# Patient Record
Sex: Female | Born: 1945
Health system: Southern US, Community
[De-identification: ages and names within clinical notes are randomized; demographics above are authoritative.]

## PROBLEM LIST (undated history)

## (undated) DIAGNOSIS — I10 Essential (primary) hypertension: Secondary | ICD-10-CM

## (undated) DIAGNOSIS — R32 Unspecified urinary incontinence: Secondary | ICD-10-CM

## (undated) DIAGNOSIS — E559 Vitamin D deficiency, unspecified: Secondary | ICD-10-CM

## (undated) DIAGNOSIS — M8580 Other specified disorders of bone density and structure, unspecified site: Secondary | ICD-10-CM

## (undated) DIAGNOSIS — G4710 Hypersomnia, unspecified: Secondary | ICD-10-CM

## (undated) DIAGNOSIS — K219 Gastro-esophageal reflux disease without esophagitis: Secondary | ICD-10-CM

## (undated) DIAGNOSIS — R5383 Other fatigue: Secondary | ICD-10-CM

## (undated) DIAGNOSIS — J45909 Unspecified asthma, uncomplicated: Secondary | ICD-10-CM

## (undated) DIAGNOSIS — I4891 Unspecified atrial fibrillation: Secondary | ICD-10-CM

## (undated) DIAGNOSIS — E785 Hyperlipidemia, unspecified: Secondary | ICD-10-CM

## (undated) HISTORY — DX: Essential (primary) hypertension: I10

## (undated) HISTORY — DX: Vitamin D deficiency, unspecified: E55.9

## (undated) HISTORY — DX: Hypersomnia, unspecified: G47.10

## (undated) HISTORY — DX: Unspecified atrial fibrillation: I48.91

## (undated) HISTORY — DX: Gastro-esophageal reflux disease without esophagitis: K21.9

## (undated) HISTORY — DX: Unspecified urinary incontinence: R32

## (undated) HISTORY — DX: Hyperlipidemia, unspecified: E78.5

## (undated) HISTORY — DX: Unspecified asthma, uncomplicated: J45.909

## (undated) HISTORY — DX: Other fatigue: R53.83

## (undated) HISTORY — DX: Other specified disorders of bone density and structure, unspecified site: M85.80

## (undated) MED FILL — Iron Sucrose Inj 20 MG/ML (Fe Equiv): INTRAVENOUS | Qty: 10 | Status: AC

---

## 1990-03-23 HISTORY — PX: ABDOMINAL HYSTERECTOMY: SHX81

## 1999-10-28 ENCOUNTER — Encounter: Payer: Self-pay | Admitting: Internal Medicine

## 1999-10-28 ENCOUNTER — Emergency Department (HOSPITAL_COMMUNITY): Admission: EM | Admit: 1999-10-28 | Discharge: 1999-10-28 | Payer: Self-pay | Admitting: Internal Medicine

## 2000-05-21 ENCOUNTER — Encounter: Payer: Self-pay | Admitting: *Deleted

## 2000-05-21 ENCOUNTER — Ambulatory Visit (HOSPITAL_COMMUNITY): Admission: RE | Admit: 2000-05-21 | Discharge: 2000-05-21 | Payer: Self-pay | Admitting: *Deleted

## 2000-10-08 ENCOUNTER — Emergency Department (HOSPITAL_COMMUNITY): Admission: EM | Admit: 2000-10-08 | Discharge: 2000-10-08 | Payer: Self-pay | Admitting: Emergency Medicine

## 2001-01-05 ENCOUNTER — Emergency Department (HOSPITAL_COMMUNITY): Admission: EM | Admit: 2001-01-05 | Discharge: 2001-01-05 | Payer: Self-pay | Admitting: Emergency Medicine

## 2001-01-05 ENCOUNTER — Encounter: Payer: Self-pay | Admitting: Emergency Medicine

## 2002-02-05 ENCOUNTER — Encounter: Payer: Self-pay | Admitting: Emergency Medicine

## 2002-02-06 ENCOUNTER — Inpatient Hospital Stay (HOSPITAL_COMMUNITY): Admission: EM | Admit: 2002-02-06 | Discharge: 2002-02-07 | Payer: Self-pay | Admitting: Emergency Medicine

## 2005-03-23 HISTORY — PX: CHOLECYSTECTOMY: SHX55

## 2011-01-15 ENCOUNTER — Ambulatory Visit: Payer: Self-pay

## 2011-05-04 NOTE — Plan of Care (Addendum)
°

## 2012-06-07 ENCOUNTER — Encounter: Payer: Medicare Other | Admitting: Family Medicine

## 2012-06-14 ENCOUNTER — Encounter: Payer: Self-pay | Admitting: Obstetrics & Gynecology

## 2012-06-14 ENCOUNTER — Ambulatory Visit (INDEPENDENT_AMBULATORY_CARE_PROVIDER_SITE_OTHER): Payer: Medicare Other | Admitting: Obstetrics & Gynecology

## 2012-06-14 VITALS — BP 155/83 | HR 69 | Ht 66.5 in | Wt 215.0 lb

## 2012-06-14 DIAGNOSIS — Z8041 Family history of malignant neoplasm of ovary: Secondary | ICD-10-CM

## 2012-06-14 DIAGNOSIS — R350 Frequency of micturition: Secondary | ICD-10-CM

## 2012-06-14 DIAGNOSIS — R102 Pelvic and perineal pain: Secondary | ICD-10-CM

## 2012-06-14 DIAGNOSIS — N390 Urinary tract infection, site not specified: Secondary | ICD-10-CM

## 2012-06-14 DIAGNOSIS — Z01419 Encounter for gynecological examination (general) (routine) without abnormal findings: Secondary | ICD-10-CM

## 2012-06-14 DIAGNOSIS — Z1239 Encounter for other screening for malignant neoplasm of breast: Secondary | ICD-10-CM

## 2012-06-14 DIAGNOSIS — N949 Unspecified condition associated with female genital organs and menstrual cycle: Secondary | ICD-10-CM

## 2012-06-14 LAB — POCT URINALYSIS DIPSTICK
Bilirubin, UA: NEGATIVE
Blood, UA: NEGATIVE
Ketones, UA: NEGATIVE
Spec Grav, UA: 1.02
pH, UA: 5

## 2012-06-14 MED ORDER — SULFAMETHOXAZOLE-TRIMETHOPRIM 800-160 MG PO TABS: 1.0000 | ORAL_TABLET | Freq: Two times a day (BID) | ORAL | Status: DC

## 2012-06-14 NOTE — Progress Notes (Signed)
Here today for yearly gyn physical.  Dr. Loma Sender is her primary care physician.  Has had several episodes with her sugars being high or low, never been diagnosed with Diabetes.  Does have family history of diabetes. Having urge incontinence, taking oxybutynin CL for this but no relief.  Has family history of breast cancer in her mom and two sisters.

## 2012-06-14 NOTE — Patient Instructions (Signed)
Preventive Care for Adults, Female A healthy lifestyle and preventive care can promote health and wellness. Preventive health guidelines for women include the following key practices.  A routine yearly physical is a good way to check with your caregiver about your health and preventive screening. It is a chance to share any concerns and updates on your health, and to receive a thorough exam.  Visit your dentist for a routine exam and preventive care every 6 months. Brush your teeth twice a day and floss once a day. Good oral hygiene prevents tooth decay and gum disease.  The frequency of eye exams is based on your age, health, family medical history, use of contact lenses, and other factors. Follow your caregiver's recommendations for frequency of eye exams.  Eat a healthy diet. Foods like vegetables, fruits, whole grains, low-fat dairy products, and lean protein foods contain the nutrients you need without too many calories. Decrease your intake of foods high in solid fats, added sugars, and salt. Eat the right amount of calories for you.Get information about a proper diet from your caregiver, if necessary.  Regular physical exercise is one of the most important things you can do for your health. Most adults should get at least 150 minutes of moderate-intensity exercise (any activity that increases your heart rate and causes you to sweat) each week. In addition, most adults need muscle-strengthening exercises on 2 or more days a week.  Maintain a healthy weight. The body mass index (BMI) is a screening tool to identify possible weight problems. It provides an estimate of body fat based on height and weight. Your caregiver can help determine your BMI, and can help you achieve or maintain a healthy weight.For adults 20 years and older:  A BMI below 18.5 is considered underweight.  A BMI of 18.5 to 24.9 is normal.  A BMI of 25 to 29.9 is considered overweight.  A BMI of 30 and above is  considered obese.  Maintain normal blood lipids and cholesterol levels by exercising and minimizing your intake of saturated fat. Eat a balanced diet with plenty of fruit and vegetables. Blood tests for lipids and cholesterol should begin at age 14 and be repeated every 5 years. If your lipid or cholesterol levels are high, you are over 50, or you are at high risk for heart disease, you may need your cholesterol levels checked more frequently.Ongoing high lipid and cholesterol levels should be treated with medicines if diet and exercise are not effective.  If you smoke, find out from your caregiver how to quit. If you do not use tobacco, do not start.  If you are pregnant, do not drink alcohol. If you are breastfeeding, be very cautious about drinking alcohol. If you are not pregnant and choose to drink alcohol, do not exceed 1 drink per day. One drink is considered to be 12 ounces (355 mL) of beer, 5 ounces (148 mL) of wine, or 1.5 ounces (44 mL) of liquor.  Avoid use of street drugs. Do not share needles with anyone. Ask for help if you need support or instructions about stopping the use of drugs.  High blood pressure causes heart disease and increases the risk of stroke. Your blood pressure should be checked at least every 1 to 2 years. Ongoing high blood pressure should be treated with medicines if weight loss and exercise are not effective.  If you are 59 to 66 years old, ask your caregiver if you should take aspirin to prevent strokes.  Diabetes  screening involves taking a blood sample to check your fasting blood sugar level. This should be done once every 3 years, after age 16, if you are within normal weight and without risk factors for diabetes. Testing should be considered at a younger age or be carried out more frequently if you are overweight and have at least 1 risk factor for diabetes.  Breast cancer screening is essential preventive care for women. You should practice "breast  self-awareness." This means understanding the normal appearance and feel of your breasts and may include breast self-examination. Any changes detected, no matter how small, should be reported to a caregiver. Women in their 52s and 30s should have a clinical breast exam (CBE) by a caregiver as part of a regular health exam every 1 to 3 years. After age 72, women should have a CBE every year. Starting at age 53, women should consider having a mammography (breast X-ray test) every year. Women who have a family history of breast cancer should talk to their caregiver about genetic screening. Women at a high risk of breast cancer should talk to their caregivers about having magnetic resonance imaging (MRI) and a mammography every year.  The Pap test is a screening test for cervical cancer. A Pap test can show cell changes on the cervix that might become cervical cancer if left untreated. A Pap test is a procedure in which cells are obtained and examined from the lower end of the uterus (cervix).  Women should have a Pap test starting at age 67.  Between ages 65 and 22, Pap tests should be repeated every 2 years.  Beginning at age 68, you should have a Pap test every 3 years as long as the past 3 Pap tests have been normal.  Some women have medical problems that increase the chance of getting cervical cancer. Talk to your caregiver about these problems. It is especially important to talk to your caregiver if a new problem develops soon after your last Pap test. In these cases, your caregiver may recommend more frequent screening and Pap tests.  The above recommendations are the same for women who have or have not gotten the vaccine for human papillomavirus (HPV).  If you had a hysterectomy for a problem that was not cancer or a condition that could lead to cancer, then you no longer need Pap tests. Even if you no longer need a Pap test, a regular exam is a good idea to make sure no other problems are  starting.  If you are between ages 43 and 26, and you have had normal Pap tests going back 10 years, you no longer need Pap tests. Even if you no longer need a Pap test, a regular exam is a good idea to make sure no other problems are starting.  If you have had past treatment for cervical cancer or a condition that could lead to cancer, you need Pap tests and screening for cancer for at least 20 years after your treatment.  If Pap tests have been discontinued, risk factors (such as a new sexual partner) need to be reassessed to determine if screening should be resumed.  The HPV test is an additional test that may be used for cervical cancer screening. The HPV test looks for the virus that can cause the cell changes on the cervix. The cells collected during the Pap test can be tested for HPV. The HPV test could be used to screen women aged 30 years and older, and should  be used in women of any age who have unclear Pap test results. After the age of 49, women should have HPV testing at the same frequency as a Pap test.  Colorectal cancer can be detected and often prevented. Most routine colorectal cancer screening begins at the age of 12 and continues through age 42. However, your caregiver may recommend screening at an earlier age if you have risk factors for colon cancer. On a yearly basis, your caregiver may provide home test kits to check for hidden blood in the stool. Use of a small camera at the end of a tube, to directly examine the colon (sigmoidoscopy or colonoscopy), can detect the earliest forms of colorectal cancer. Talk to your caregiver about this at age 70, when routine screening begins. Direct examination of the colon should be repeated every 5 to 10 years through age 61, unless early forms of pre-cancerous polyps or small growths are found.  Hepatitis C blood testing is recommended for all people born from [birth year] through 1965 and any individual with known risks for hepatitis C.  Practice  safe sex. Use condoms and avoid high-risk sexual practices to reduce the spread of sexually transmitted infections (STIs). STIs include gonorrhea, chlamydia, syphilis, trichomonas, herpes, HPV, and human immunodeficiency virus (HIV). Herpes, HIV, and HPV are viral illnesses that have no cure. They can result in disability, cancer, and death. Sexually active women aged 13 and younger should be checked for chlamydia. Older women with new or multiple partners should also be tested for chlamydia. Testing for other STIs is recommended if you are sexually active and at increased risk.  Osteoporosis is a disease in which the bones lose minerals and strength with aging. This can result in serious bone fractures. The risk of osteoporosis can be identified using a bone density scan. Women ages 44 and over and women at risk for fractures or osteoporosis should discuss screening with their caregivers. Ask your caregiver whether you should take a calcium supplement or vitamin D to reduce the rate of osteoporosis.  Menopause can be associated with physical symptoms and risks. Hormone replacement therapy is available to decrease symptoms and risks. You should talk to your caregiver about whether hormone replacement therapy is right for you.  Use sunscreen with sun protection factor (SPF) of 30 or more. Apply sunscreen liberally and repeatedly throughout the day. You should seek shade when your shadow is shorter than you. Protect yourself by wearing long sleeves, pants, a wide-brimmed hat, and sunglasses year round, whenever you are outdoors.  Once a month, do a whole body skin exam, using a mirror to look at the skin on your back. Notify your caregiver of new moles, moles that have irregular borders, moles that are larger than a pencil eraser, or moles that have changed in shape or color.  Stay current with required immunizations.  Influenza. You need a dose every fall (or winter). The composition of the flu vaccine  changes each year, so being vaccinated once is not enough.  Pneumococcal polysaccharide. You need 1 to 2 doses if you smoke cigarettes or if you have certain chronic medical conditions. You need 1 dose at age 28 (or older) if you have never been vaccinated.  Tetanus, diphtheria, pertussis (Tdap, Td). Get 1 dose of Tdap vaccine if you are younger than age 63, are over 9 and have contact with an infant, are a Research scientist (physical sciences), are pregnant, or simply want to be protected from whooping cough. After that, you need a Td  booster dose every 10 years. Consult your caregiver if you have not had at least 3 tetanus and diphtheria-containing shots sometime in your life or have a deep or dirty wound.  HPV. You need this vaccine if you are a woman age 51 or younger. The vaccine is given in 3 doses over 6 months.  Measles, mumps, rubella (MMR). You need at least 1 dose of MMR if you were born in 1957 or later. You may also need a second dose.  Meningococcal. If you are age 46 to 83 and a first-year college student living in a residence hall, or have one of several medical conditions, you need to get vaccinated against meningococcal disease. You may also need additional booster doses.  Zoster (shingles). If you are age 16 or older, you should get this vaccine.  Varicella (chickenpox). If you have never had chickenpox or you were vaccinated but received only 1 dose, talk to your caregiver to find out if you need this vaccine.  Hepatitis A. You need this vaccine if you have a specific risk factor for hepatitis A virus infection or you simply wish to be protected from this disease. The vaccine is usually given as 2 doses, 6 to 18 months apart.  Hepatitis B. You need this vaccine if you have a specific risk factor for hepatitis B virus infection or you simply wish to be protected from this disease. The vaccine is given in 3 doses, usually over 6 months. Preventive Services / Frequency Ages 73 to 60  Blood  pressure check.** / Every 1 to 2 years.  Lipid and cholesterol check.** / Every 5 years beginning at age 34.  Clinical breast exam.** / Every 3 years for women in their 36s and 30s.  Pap test.** / Every 2 years from ages 70 through 92. Every 3 years starting at age 63 through age 39 or 61 with a history of 3 consecutive normal Pap tests.  HPV screening.** / Every 3 years from ages 70 through ages 31 to 67 with a history of 3 consecutive normal Pap tests.  Hepatitis C blood test.** / For any individual with known risks for hepatitis C.  Skin self-exam. / Monthly.  Influenza immunization.** / Every year.  Pneumococcal polysaccharide immunization.** / 1 to 2 doses if you smoke cigarettes or if you have certain chronic medical conditions.  Tetanus, diphtheria, pertussis (Tdap, Td) immunization. / A one-time dose of Tdap vaccine. After that, you need a Td booster dose every 10 years.  HPV immunization. / 3 doses over 6 months, if you are 3 and younger.  Measles, mumps, rubella (MMR) immunization. / You need at least 1 dose of MMR if you were born in 1957 or later. You may also need a second dose.  Meningococcal immunization. / 1 dose if you are age 72 to 48 and a first-year college student living in a residence hall, or have one of several medical conditions, you need to get vaccinated against meningococcal disease. You may also need additional booster doses.  Varicella immunization.** / Consult your caregiver.  Hepatitis A immunization.** / Consult your caregiver. 2 doses, 6 to 18 months apart.  Hepatitis B immunization.** / Consult your caregiver. 3 doses usually over 6 months. Ages 15 to 20  Blood pressure check.** / Every 1 to 2 years.  Lipid and cholesterol check.** / Every 5 years beginning at age 4.  Clinical breast exam.** / Every year after age 71.  Mammogram.** / Every year beginning at age 76  and continuing for as long as you are in good health. Consult with your  caregiver.  Pap test.** / Every 3 years starting at age 25 through age 68 or 91 with a history of 3 consecutive normal Pap tests.  HPV screening.** / Every 3 years from ages 14 through ages 39 to 96 with a history of 3 consecutive normal Pap tests.  Fecal occult blood test (FOBT) of stool. / Every year beginning at age 30 and continuing until age 59. You may not need to do this test if you get a colonoscopy every 10 years.  Flexible sigmoidoscopy or colonoscopy.** / Every 5 years for a flexible sigmoidoscopy or every 10 years for a colonoscopy beginning at age 52 and continuing until age 75.  Hepatitis C blood test.** / For all people born from [birth year] through 1965 and any individual with known risks for hepatitis C.  Skin self-exam. / Monthly.  Influenza immunization.** / Every year.  Pneumococcal polysaccharide immunization.** / 1 to 2 doses if you smoke cigarettes or if you have certain chronic medical conditions.  Tetanus, diphtheria, pertussis (Tdap, Td) immunization.** / A one-time dose of Tdap vaccine. After that, you need a Td booster dose every 10 years.  Measles, mumps, rubella (MMR) immunization. / You need at least 1 dose of MMR if you were born in 1957 or later. You may also need a second dose.  Varicella immunization.** / Consult your caregiver.  Meningococcal immunization.** / Consult your caregiver.  Hepatitis A immunization.** / Consult your caregiver. 2 doses, 6 to 18 months apart.  Hepatitis B immunization.** / Consult your caregiver. 3 doses, usually over 6 months. Ages 44 and over  Blood pressure check.** / Every 1 to 2 years.  Lipid and cholesterol check.** / Every 5 years beginning at age 76.  Clinical breast exam.** / Every year after age 68.  Mammogram.** / Every year beginning at age 39 and continuing for as long as you are in good health. Consult with your caregiver.  Pap test.** / Every 3 years starting at age 42 through age 43 or 89 with a 3  consecutive normal Pap tests. Testing can be stopped between 65 and 70 with 3 consecutive normal Pap tests and no abnormal Pap or HPV tests in the past 10 years.  HPV screening.** / Every 3 years from ages 76 through ages 78 or 56 with a history of 3 consecutive normal Pap tests. Testing can be stopped between 65 and 70 with 3 consecutive normal Pap tests and no abnormal Pap or HPV tests in the past 10 years.  Fecal occult blood test (FOBT) of stool. / Every year beginning at age 2 and continuing until age 55. You may not need to do this test if you get a colonoscopy every 10 years.  Flexible sigmoidoscopy or colonoscopy.** / Every 5 years for a flexible sigmoidoscopy or every 10 years for a colonoscopy beginning at age 40 and continuing until age 85.  Hepatitis C blood test.** / For all people born from [birth year] through 1964 and any individual with known risks for hepatitis C.  Osteoporosis screening.** / A one-time screening for women ages 41 and over and women at risk for fractures or osteoporosis.  Skin self-exam. / Monthly.  Influenza immunization.** / Every year.  Pneumococcal polysaccharide immunization.** / 1 dose at age 20 (or older) if you have never been vaccinated.  Tetanus, diphtheria, pertussis (Tdap, Td) immunization. / A one-time dose of Tdap vaccine if you are over  65 and have contact with an infant, are a Research scientist (physical sciences), or simply want to be protected from whooping cough. After that, you need a Td booster dose every 10 years.  Varicella immunization.** / Consult your caregiver.  Meningococcal immunization.** / Consult your caregiver.  Hepatitis A immunization.** / Consult your caregiver. 2 doses, 6 to 18 months apart.  Hepatitis B immunization.** / Check with your caregiver. 3 doses, usually over 6 months. ** Family history and personal history of risk and conditions may change your caregiver's recommendations. Document Released: 05/05/2001 Document Revised: 06/01/2011  Document Reviewed: 08/04/2010 [facility] Patient Information 2013 [place], [place].

## 2012-06-14 NOTE — Progress Notes (Signed)
°  Subjective:     Taylor Arroyo is a 67 y.o. female s/p hysterectomy for benign indications and is here for a comprehensive physical exam. The patient reports frequent urination.  Also reports lower pelvic pain, concerned about her ovaries as her mother died from ovarian cancer.  No other GYN concerns. .  History   Social History   Marital Status: Married    Spouse Name: N/A    Number of Children: N/A   Years of Education: N/A   Occupational History   Not on file.   Social History Main Topics   Smoking status: Never Smoker    Smokeless tobacco: Not on file   Alcohol Use: 0.5 oz/week    1 drink(s) per week   Drug Use: No   Sexually Active: No   Other Topics Concern   Not on file   Social History Narrative   No narrative on file   No health maintenance topics applied.  The following portions of the patient's history were reviewed and updated as appropriate: allergies, current medications, past family history, past medical history, past social history, past surgical history and problem list.  Review of Systems Pertinent items are noted in HPI.   Objective:   BP 155/83   Pulse 69   Ht 5' 6.5" (1.689 m)   Wt 215 lb (97.523 kg)   BMI 34.19 kg/m2 GENERAL: Well-developed, well-nourished female in no acute distress.  HEENT: Normocephalic, atraumatic. Sclerae anicteric.  NECK: Supple. Normal thyroid.  LUNGS: Clear to auscultation bilaterally.  HEART: Regular rate and rhythm. BREASTS: Symmetric in size. No masses, skin changes, nipple drainage, or lymphadenopathy. ABDOMEN: Soft, obese, nontender, nondistended. No organomegaly. PELVIC: Normal external female genitalia. Vagina is pink and rugated.  Normal discharge. Normal vaginal cuff. No adnexal mass or tenderness on bimanual exam.  EXTREMITIES: No cyanosis, clubbing, or edema, 2+ distal pulses.  Results for orders placed in visit on 06/14/12 (from the past 24 hour(s))  POCT URINALYSIS DIPSTICK     Status:  Abnormal   Collection Time    06/14/12 11:47 AM      Result Value Range   Color, UA yellow     Clarity, UA clear     Glucose, UA neg     Bilirubin, UA neg     Ketones, UA neg     Spec Grav, UA 1.020     Blood, UA neg     pH, UA 5.0     Protein, UA trace     Urobilinogen, UA 0.2     Nitrite, UA neg     Leukocytes, UA small (1+)        Assessment:    Healthy female exam. Urinary symptoms Pelvic pain     Plan:   No pap smear indicated, not done today Mammogram scheduled UA showed small LE, trace protein; Bactrim prescribed.  Will follow up urine culture Pelvic ultrasound ordered for evaluation of her pelvic pain and ovaries Referral to [facility] ordered; she wants to change PCPs Routine preventative health maintenance measures emphasized

## 2012-06-24 ENCOUNTER — Ambulatory Visit (HOSPITAL_COMMUNITY)
Admission: RE | Admit: 2012-06-24 | Discharge: 2012-06-24 | Disposition: A | Discharge status: Home / Self Care | Payer: Medicare Other | Source: Ambulatory Visit | Attending: Obstetrics & Gynecology | Admitting: Obstetrics & Gynecology

## 2012-06-24 DIAGNOSIS — N949 Unspecified condition associated with female genital organs and menstrual cycle: Secondary | ICD-10-CM | POA: Insufficient documentation

## 2012-06-24 DIAGNOSIS — Z8041 Family history of malignant neoplasm of ovary: Secondary | ICD-10-CM

## 2012-06-24 DIAGNOSIS — Z1239 Encounter for other screening for malignant neoplasm of breast: Secondary | ICD-10-CM

## 2012-06-24 DIAGNOSIS — R102 Pelvic and perineal pain: Secondary | ICD-10-CM

## 2012-06-24 DIAGNOSIS — Z1231 Encounter for screening mammogram for malignant neoplasm of breast: Secondary | ICD-10-CM | POA: Insufficient documentation

## 2012-06-24 DIAGNOSIS — Z01419 Encounter for gynecological examination (general) (routine) without abnormal findings: Secondary | ICD-10-CM

## 2012-06-24 DIAGNOSIS — Z90710 Acquired absence of both cervix and uterus: Secondary | ICD-10-CM | POA: Insufficient documentation

## 2012-06-24 NOTE — Procedures (Signed)
°

## 2012-09-07 ENCOUNTER — Encounter: Payer: Self-pay | Admitting: Obstetrics & Gynecology

## 2012-09-07 ENCOUNTER — Ambulatory Visit (INDEPENDENT_AMBULATORY_CARE_PROVIDER_SITE_OTHER): Payer: Medicare Other | Admitting: Obstetrics & Gynecology

## 2012-09-07 VITALS — BP 163/78 | HR 67 | Ht 66.5 in | Wt 218.0 lb

## 2012-09-07 DIAGNOSIS — M62838 Other muscle spasm: Secondary | ICD-10-CM

## 2012-09-07 DIAGNOSIS — Z7189 Other specified counseling: Secondary | ICD-10-CM

## 2012-09-07 DIAGNOSIS — Z712 Person consulting for explanation of examination or test findings: Secondary | ICD-10-CM

## 2012-09-07 MED ORDER — CYCLOBENZAPRINE HCL 10 MG PO TABS: 10.0000 mg | ORAL_TABLET | Freq: Three times a day (TID) | ORAL | Status: DC | PRN

## 2012-09-07 NOTE — Patient Instructions (Signed)
Return to clinic for any scheduled appointments or for any gynecologic concerns as needed.

## 2012-09-07 NOTE — Progress Notes (Signed)
GYNECOLOGY CLINIC PROGRESS NOTE  History:  67 y.o. [id number] here today for followup of results of pelvic ultrasound.  Reports muscle spasm in waist and back from carrying disable mother and helping her at home. No GYN concerns.   The following portions of the patient's history were reviewed and updated as appropriate: allergies, current medications, past family history, past medical history, past social history, past surgical history and problem list.  Review of Systems:  Pertinent items are noted in HPI.  Objective:  Physical Exam BP 163/78   Pulse 67   Ht 5' 6.5" (1.689 m)   Wt 218 lb (98.884 kg)   BMI 34.66 kg/m2 Deferred  Labs and Imaging 06/27/12 Normal mammogram 06/24/2012  TRANSABDOMINAL AND TRANSVAGINAL ULTRASOUND OF PELVIS Clinical Data: Abdominal and pelvic pain. Post hysterectomy. Family history of ovarian cancer. Comparison: None Findings: Uterus: Has been surgically removed. A normal vaginal cuff is seen. Endometrium: Not applicable. Right ovary: Is not seen with confidence either transabdominally or endovaginally.  Left ovary: Measures 1.9 x 1.2 x 1.2 cm and has a normal appearance. Other findings: No pelvic fluid or separate adnexal masses are seen  IMPRESSION: Normal vaginal cuff and left ovary. Non-visualized right ovary.    Assessment & Plan:  Patient reassured by negative ultrasound Flexeril prescribed prn muscle spasms Routine preventative health maintenance measures emphasized

## 2012-10-10 ENCOUNTER — Encounter (HOSPITAL_COMMUNITY): Payer: Self-pay | Admitting: *Deleted

## 2012-10-10 ENCOUNTER — Emergency Department (HOSPITAL_COMMUNITY): Payer: Medicare Other

## 2012-10-10 ENCOUNTER — Emergency Department (HOSPITAL_COMMUNITY)
Admission: EM | Admit: 2012-10-10 | Discharge: 2012-10-10 | Disposition: A | Discharge status: Home / Self Care | Payer: Medicare Other | Attending: Emergency Medicine | Admitting: Emergency Medicine

## 2012-10-10 DIAGNOSIS — M1710 Unilateral primary osteoarthritis, unspecified knee: Secondary | ICD-10-CM | POA: Insufficient documentation

## 2012-10-10 DIAGNOSIS — IMO0001 Reserved for inherently not codable concepts without codable children: Secondary | ICD-10-CM | POA: Insufficient documentation

## 2012-10-10 DIAGNOSIS — M170 Bilateral primary osteoarthritis of knee: Secondary | ICD-10-CM

## 2012-10-10 DIAGNOSIS — I10 Essential (primary) hypertension: Secondary | ICD-10-CM | POA: Insufficient documentation

## 2012-10-10 DIAGNOSIS — Z880 Allergy status to penicillin: Secondary | ICD-10-CM | POA: Insufficient documentation

## 2012-10-10 DIAGNOSIS — IMO0002 Reserved for concepts with insufficient information to code with codable children: Secondary | ICD-10-CM | POA: Insufficient documentation

## 2012-10-10 DIAGNOSIS — Z79899 Other long term (current) drug therapy: Secondary | ICD-10-CM | POA: Insufficient documentation

## 2012-10-10 DIAGNOSIS — M25469 Effusion, unspecified knee: Secondary | ICD-10-CM | POA: Insufficient documentation

## 2012-10-10 DIAGNOSIS — R609 Edema, unspecified: Secondary | ICD-10-CM | POA: Insufficient documentation

## 2012-10-10 MED ORDER — HYDROCODONE-ACETAMINOPHEN 5-325 MG PO TABS: ORAL_TABLET | ORAL | Status: DC

## 2012-10-10 NOTE — ED Provider Notes (Signed)
History    CSN: [id number] Arrival date & time 10/10/12  1211  First MD Initiated Contact with Patient 10/10/12 1219     Chief Complaint  Patient presents with   Knee Pain   (Consider location/radiation/quality/duration/timing/severity/associated sxs/prior Treatment) HPI Pt is a 67yo female presenting today with bilateral knee pain for 20yr.  Advised to come to ER or orthopedist by PCP.  Pt states she was given tramadol by PCP but that has not helped with her pain.  Pain is constant aching and burning in medial aspect of both knees, 10/10 at worst, 6/10 at best.   Pain worsened over the last 4 days after doing house work this weekend.  Has tried tramadol, advil, aleve, and ibuprofen with minimal relief.  Denies any trauma to the knees.  Has never seen orthopedist for same.    Past Medical History  Diagnosis Date   Hypertension    Past Surgical History  Procedure Laterality Date   Abdominal hysterectomy  1992    still has ovaries   Cholecystectomy  2007   Family History  Problem Relation Age of Onset   Diabetes Mother    Heart disease Mother    Stroke Mother    Cancer Mother    Diabetes Sister    Heart disease Sister    Cancer Sister    History  Substance Use Topics   Smoking status: Never Smoker    Smokeless tobacco: Never Used   Alcohol Use: 0.5 oz/week    1 drink(s) per week     Comment: social   OB History   Grav Para Term Preterm Abortions TAB SAB Ect Mult Living   7    4  4   3      Review of Systems  Constitutional: Negative for fever and chills.  Musculoskeletal: Positive for myalgias, joint swelling and arthralgias.       Bilateral knees   Skin: Negative for color change, rash and wound.  All other systems reviewed and are negative.    Allergies  Penicillins  Home Medications   Current Outpatient Rx  Name  Route  Sig  Dispense  Refill   acetaminophen (TYLENOL) 500 MG tablet   Oral   Take 500 mg by mouth every 6 (six) hours as  needed for pain.          Aspirin-Caffeine (BC FAST PAIN RELIEF PO)   Oral   Take 1 packet by mouth.          cyclobenzaprine (FLEXERIL) 10 MG tablet   Oral   Take 1 tablet (10 mg total) by mouth 3 (three) times daily as needed for muscle spasms.   30 tablet   2    diazepam (VALIUM) 5 MG tablet   Oral   Take 5 mg by mouth every 12 (twelve) hours as needed for anxiety.           enalapril (VASOTEC) 5 MG tablet   Oral   Take 5 mg by mouth daily.          furosemide (LASIX) 20 MG tablet   Oral   Take 20 mg by mouth 2 (two) times daily.          hydrOXYzine (ATARAX/VISTARIL) 25 MG tablet   Oral   Take 25 mg by mouth 3 (three) times daily as needed for itching.          meloxicam (MOBIC) 7.5 MG tablet   Oral   Take 7.5 mg by mouth daily.  naproxen sodium (ANAPROX) 220 MG tablet   Oral   Take 220 mg by mouth 2 (two) times daily with a meal.          oxybutynin (DITROPAN) 5 MG tablet   Oral   Take 5 mg by mouth 3 (three) times daily.          propranolol (INDERAL) 20 MG tablet   Oral   Take 20 mg by mouth 3 (three) times daily.          traMADol (ULTRAM) 50 MG tablet   Oral   Take 50 mg by mouth every 6 (six) hours as needed for pain.          triamterene-hydrochlorothiazide (DYAZIDE) 37.5-25 MG per capsule   Oral   Take 1 capsule by mouth every morning.          HYDROcodone-acetaminophen (NORCO/VICODIN) 5-325 MG per tablet      Take 1-2 pills every 4-6 hours as needed for pain   6 tablet   0    BP 152/67   Pulse 68   Temp(Src) 98 F (36.7 C) (Oral)   Resp 18   SpO2 100% Physical Exam  Nursing note and vitals reviewed. Constitutional: She appears well-developed and well-nourished.  HENT:  Head: Normocephalic and atraumatic.  Eyes: Conjunctivae are normal. No scleral icterus.  Neck: Normal range of motion.  Cardiovascular: Normal rate, regular rhythm and normal heart sounds.   Pulmonary/Chest: Effort normal and  breath sounds normal. No respiratory distress. She has no wheezes. She has no rales. She exhibits no tenderness.  Musculoskeletal: Normal range of motion. She exhibits edema (mild edema of inferior portion of left knee) and tenderness ( inferior, medial aspect of both knees).  Neurological: She is alert.  Skin: Skin is warm and dry. No rash noted. No erythema. No pallor.  Psychiatric: She has a normal mood and affect. Her behavior is normal.    ED Course  Procedures (including critical care time) Labs Reviewed - No data to display Dg Knee Complete 4 Views Left  10/10/2012   *RADIOLOGY REPORT*  Clinical Data: Bilateral knee pain and burning for past year, progressively worse, no known injury  LEFT KNEE - COMPLETE 4+ VIEW  Comparison: None  Findings: Mild joint space narrowing. Bones demineralized. Subchondral cyst formation and articular surface irregularity at the patella. Small patellar spur at quadriceps tendon insertion. No acute fracture, dislocation or bone destruction. No knee joint effusion.  IMPRESSION: Osseous demineralization with mild degenerative changes left knee.   Original Report Authenticated By: Ulyses Southward, M.D.   Dg Knee Complete 4 Views Right  10/10/2012   *RADIOLOGY REPORT*  Clinical Data: Bilateral knee pain and burning for past year, progressively worse, no known injury  RIGHT KNEE - COMPLETE 4+ VIEW  Comparison: None  Findings: Osseous demineralization. Joint space narrowing and marginal spur formation greatest at medial compartment. No acute fracture, dislocation or bone destruction. No knee joint effusion or regional soft tissue abnormalities.  IMPRESSION: Osseous demineralization with osteoarthritic changes right knee.   Original Report Authenticated By: Ulyses Southward, M.D.   1. Osteoarthritis of both knees     MDM  Pt has bilateral knee pain for 1 year.  Pt likely has OA.  Will get plain films and refer to orthopedist.    Plain films: osseous demineralization in both  knees, osteoarthritic changes in right knee.   Rx: norco (6 tabs) and knee sleeves.  Pt is already taking mobic and acetaminophen.  Referred to Dr.  Chandler to make a follow up appointment for ongoing knee pain.  May also try [facility] if unable to get earlier appointment.  Return precautions given. Pt verbalized understanding and agreement with tx plan. Vitals: unremarkable. Discharged in stable condition.       Junius Finner, PA-C 10/11/12 1235

## 2012-10-10 NOTE — ED Notes (Signed)
Pt states she's had bil knee pain x 1 year, has seen PCP once for knee pain and she states he told her she needed to come to ER or see an orthopedic b/c that wasn't his specialty, pt states tramadol he gave her is not working for pain, pt states pain in R knee is 10/10 and pain in L knee is 5/10, burning pain.

## 2012-10-11 NOTE — Consent Form (Signed)
°

## 2012-10-13 NOTE — ED Provider Notes (Signed)
Medical screening examination/treatment/procedure(s) were performed by non-physician practitioner and as supervising physician I was immediately available for consultation/collaboration.   Benny Lennert, MD 10/13/12 [phone]

## 2012-12-28 ENCOUNTER — Other Ambulatory Visit: Payer: Self-pay | Admitting: Obstetrics & Gynecology

## 2013-03-06 ENCOUNTER — Other Ambulatory Visit: Payer: Self-pay | Admitting: Obstetrics & Gynecology

## 2013-03-28 ENCOUNTER — Other Ambulatory Visit: Payer: Self-pay | Admitting: Obstetrics & Gynecology

## 2014-01-22 ENCOUNTER — Encounter (HOSPITAL_COMMUNITY): Payer: Self-pay | Admitting: *Deleted

## 2017-05-10 DIAGNOSIS — M62838 Other muscle spasm: Secondary | ICD-10-CM | POA: Diagnosis not present

## 2017-05-10 DIAGNOSIS — K219 Gastro-esophageal reflux disease without esophagitis: Secondary | ICD-10-CM | POA: Diagnosis not present

## 2017-05-10 DIAGNOSIS — I10 Essential (primary) hypertension: Secondary | ICD-10-CM | POA: Diagnosis not present

## 2017-05-10 DIAGNOSIS — Z9181 History of falling: Secondary | ICD-10-CM | POA: Diagnosis not present

## 2017-05-10 DIAGNOSIS — E669 Obesity, unspecified: Secondary | ICD-10-CM | POA: Diagnosis not present

## 2017-05-10 DIAGNOSIS — E538 Deficiency of other specified B group vitamins: Secondary | ICD-10-CM | POA: Diagnosis not present

## 2017-05-10 DIAGNOSIS — Z79899 Other long term (current) drug therapy: Secondary | ICD-10-CM | POA: Diagnosis not present

## 2017-05-10 DIAGNOSIS — F419 Anxiety disorder, unspecified: Secondary | ICD-10-CM | POA: Diagnosis not present

## 2017-05-10 DIAGNOSIS — E782 Mixed hyperlipidemia: Secondary | ICD-10-CM | POA: Diagnosis not present

## 2017-05-10 DIAGNOSIS — Z1331 Encounter for screening for depression: Secondary | ICD-10-CM | POA: Diagnosis not present

## 2017-05-31 DIAGNOSIS — Z6832 Body mass index (BMI) 32.0-32.9, adult: Secondary | ICD-10-CM | POA: Diagnosis not present

## 2017-05-31 DIAGNOSIS — Z1231 Encounter for screening mammogram for malignant neoplasm of breast: Secondary | ICD-10-CM | POA: Diagnosis not present

## 2017-05-31 DIAGNOSIS — Z0000 Encounter for general adult medical examination without abnormal findings: Secondary | ICD-10-CM | POA: Diagnosis not present

## 2017-05-31 DIAGNOSIS — Z139 Encounter for screening, unspecified: Secondary | ICD-10-CM | POA: Diagnosis not present

## 2017-05-31 DIAGNOSIS — Z23 Encounter for immunization: Secondary | ICD-10-CM | POA: Diagnosis not present

## 2017-05-31 DIAGNOSIS — E785 Hyperlipidemia, unspecified: Secondary | ICD-10-CM | POA: Diagnosis not present

## 2017-05-31 DIAGNOSIS — N959 Unspecified menopausal and perimenopausal disorder: Secondary | ICD-10-CM | POA: Diagnosis not present

## 2017-05-31 DIAGNOSIS — Z136 Encounter for screening for cardiovascular disorders: Secondary | ICD-10-CM | POA: Diagnosis not present

## 2017-05-31 DIAGNOSIS — E669 Obesity, unspecified: Secondary | ICD-10-CM | POA: Diagnosis not present

## 2017-06-07 DIAGNOSIS — M170 Bilateral primary osteoarthritis of knee: Secondary | ICD-10-CM | POA: Diagnosis not present

## 2017-09-27 DIAGNOSIS — M1711 Unilateral primary osteoarthritis, right knee: Secondary | ICD-10-CM | POA: Diagnosis not present

## 2017-09-27 DIAGNOSIS — M1712 Unilateral primary osteoarthritis, left knee: Secondary | ICD-10-CM | POA: Diagnosis not present

## 2017-12-16 DIAGNOSIS — F419 Anxiety disorder, unspecified: Secondary | ICD-10-CM | POA: Diagnosis not present

## 2017-12-16 DIAGNOSIS — M170 Bilateral primary osteoarthritis of knee: Secondary | ICD-10-CM | POA: Diagnosis not present

## 2017-12-16 DIAGNOSIS — Z6833 Body mass index (BMI) 33.0-33.9, adult: Secondary | ICD-10-CM | POA: Diagnosis not present

## 2017-12-16 DIAGNOSIS — Z23 Encounter for immunization: Secondary | ICD-10-CM | POA: Diagnosis not present

## 2017-12-16 DIAGNOSIS — Z79899 Other long term (current) drug therapy: Secondary | ICD-10-CM | POA: Diagnosis not present

## 2017-12-16 DIAGNOSIS — J309 Allergic rhinitis, unspecified: Secondary | ICD-10-CM | POA: Diagnosis not present

## 2017-12-16 DIAGNOSIS — N39498 Other specified urinary incontinence: Secondary | ICD-10-CM | POA: Diagnosis not present

## 2018-02-15 DIAGNOSIS — M170 Bilateral primary osteoarthritis of knee: Secondary | ICD-10-CM | POA: Diagnosis not present

## 2018-02-15 DIAGNOSIS — R399 Unspecified symptoms and signs involving the genitourinary system: Secondary | ICD-10-CM | POA: Diagnosis not present

## 2018-02-15 DIAGNOSIS — Z6834 Body mass index (BMI) 34.0-34.9, adult: Secondary | ICD-10-CM | POA: Diagnosis not present

## 2018-04-18 DIAGNOSIS — J0191 Acute recurrent sinusitis, unspecified: Secondary | ICD-10-CM | POA: Diagnosis not present

## 2018-04-18 DIAGNOSIS — Z6833 Body mass index (BMI) 33.0-33.9, adult: Secondary | ICD-10-CM | POA: Diagnosis not present

## 2018-04-18 DIAGNOSIS — J209 Acute bronchitis, unspecified: Secondary | ICD-10-CM | POA: Diagnosis not present

## 2018-06-20 DIAGNOSIS — J209 Acute bronchitis, unspecified: Secondary | ICD-10-CM | POA: Diagnosis not present

## 2018-09-12 DIAGNOSIS — M62838 Other muscle spasm: Secondary | ICD-10-CM | POA: Diagnosis not present

## 2018-09-12 DIAGNOSIS — Z9181 History of falling: Secondary | ICD-10-CM | POA: Diagnosis not present

## 2018-09-12 DIAGNOSIS — Z1331 Encounter for screening for depression: Secondary | ICD-10-CM | POA: Diagnosis not present

## 2018-09-12 DIAGNOSIS — I10 Essential (primary) hypertension: Secondary | ICD-10-CM | POA: Diagnosis not present

## 2018-09-12 DIAGNOSIS — F419 Anxiety disorder, unspecified: Secondary | ICD-10-CM | POA: Diagnosis not present

## 2018-09-12 DIAGNOSIS — E669 Obesity, unspecified: Secondary | ICD-10-CM | POA: Diagnosis not present

## 2018-09-12 DIAGNOSIS — Z79899 Other long term (current) drug therapy: Secondary | ICD-10-CM | POA: Diagnosis not present

## 2018-09-12 DIAGNOSIS — K219 Gastro-esophageal reflux disease without esophagitis: Secondary | ICD-10-CM | POA: Diagnosis not present

## 2018-09-12 DIAGNOSIS — E782 Mixed hyperlipidemia: Secondary | ICD-10-CM | POA: Diagnosis not present

## 2018-09-12 DIAGNOSIS — E538 Deficiency of other specified B group vitamins: Secondary | ICD-10-CM | POA: Diagnosis not present

## 2018-12-20 DIAGNOSIS — J45909 Unspecified asthma, uncomplicated: Secondary | ICD-10-CM | POA: Diagnosis not present

## 2018-12-20 DIAGNOSIS — Z1211 Encounter for screening for malignant neoplasm of colon: Secondary | ICD-10-CM | POA: Diagnosis not present

## 2018-12-20 DIAGNOSIS — Z6833 Body mass index (BMI) 33.0-33.9, adult: Secondary | ICD-10-CM | POA: Diagnosis not present

## 2018-12-20 DIAGNOSIS — Z1231 Encounter for screening mammogram for malignant neoplasm of breast: Secondary | ICD-10-CM | POA: Diagnosis not present

## 2018-12-20 DIAGNOSIS — Z23 Encounter for immunization: Secondary | ICD-10-CM | POA: Diagnosis not present

## 2018-12-20 DIAGNOSIS — Z139 Encounter for screening, unspecified: Secondary | ICD-10-CM | POA: Diagnosis not present

## 2018-12-20 DIAGNOSIS — N959 Unspecified menopausal and perimenopausal disorder: Secondary | ICD-10-CM | POA: Diagnosis not present

## 2018-12-22 ENCOUNTER — Other Ambulatory Visit: Payer: Self-pay | Admitting: Nurse Practitioner

## 2018-12-22 DIAGNOSIS — E2839 Other primary ovarian failure: Secondary | ICD-10-CM

## 2018-12-22 DIAGNOSIS — Z1231 Encounter for screening mammogram for malignant neoplasm of breast: Secondary | ICD-10-CM

## 2019-05-03 DIAGNOSIS — I10 Essential (primary) hypertension: Secondary | ICD-10-CM | POA: Diagnosis not present

## 2019-05-03 DIAGNOSIS — K219 Gastro-esophageal reflux disease without esophagitis: Secondary | ICD-10-CM | POA: Diagnosis not present

## 2019-05-03 DIAGNOSIS — E559 Vitamin D deficiency, unspecified: Secondary | ICD-10-CM | POA: Diagnosis not present

## 2019-05-03 DIAGNOSIS — Z79899 Other long term (current) drug therapy: Secondary | ICD-10-CM | POA: Diagnosis not present

## 2019-05-03 DIAGNOSIS — F419 Anxiety disorder, unspecified: Secondary | ICD-10-CM | POA: Diagnosis not present

## 2019-05-03 DIAGNOSIS — E782 Mixed hyperlipidemia: Secondary | ICD-10-CM | POA: Diagnosis not present

## 2019-05-03 DIAGNOSIS — E669 Obesity, unspecified: Secondary | ICD-10-CM | POA: Diagnosis not present

## 2019-05-03 DIAGNOSIS — M62838 Other muscle spasm: Secondary | ICD-10-CM | POA: Diagnosis not present

## 2019-05-03 DIAGNOSIS — M170 Bilateral primary osteoarthritis of knee: Secondary | ICD-10-CM | POA: Diagnosis not present

## 2019-05-03 DIAGNOSIS — E538 Deficiency of other specified B group vitamins: Secondary | ICD-10-CM | POA: Diagnosis not present

## 2019-05-24 DIAGNOSIS — R300 Dysuria: Secondary | ICD-10-CM | POA: Diagnosis not present

## 2019-05-24 DIAGNOSIS — N390 Urinary tract infection, site not specified: Secondary | ICD-10-CM | POA: Diagnosis not present

## 2019-05-24 DIAGNOSIS — Z6834 Body mass index (BMI) 34.0-34.9, adult: Secondary | ICD-10-CM | POA: Diagnosis not present

## 2019-07-10 DIAGNOSIS — N390 Urinary tract infection, site not specified: Secondary | ICD-10-CM | POA: Diagnosis not present

## 2019-07-10 DIAGNOSIS — N3941 Urge incontinence: Secondary | ICD-10-CM | POA: Diagnosis not present

## 2019-07-10 DIAGNOSIS — N3946 Mixed incontinence: Secondary | ICD-10-CM | POA: Diagnosis not present

## 2019-07-10 DIAGNOSIS — N3281 Overactive bladder: Secondary | ICD-10-CM | POA: Diagnosis not present

## 2019-07-10 DIAGNOSIS — N952 Postmenopausal atrophic vaginitis: Secondary | ICD-10-CM | POA: Diagnosis not present

## 2019-07-17 DIAGNOSIS — J45901 Unspecified asthma with (acute) exacerbation: Secondary | ICD-10-CM | POA: Diagnosis not present

## 2019-07-17 DIAGNOSIS — Z6834 Body mass index (BMI) 34.0-34.9, adult: Secondary | ICD-10-CM | POA: Diagnosis not present

## 2019-07-31 DIAGNOSIS — E559 Vitamin D deficiency, unspecified: Secondary | ICD-10-CM | POA: Diagnosis not present

## 2019-07-31 DIAGNOSIS — I10 Essential (primary) hypertension: Secondary | ICD-10-CM | POA: Diagnosis not present

## 2019-07-31 DIAGNOSIS — F419 Anxiety disorder, unspecified: Secondary | ICD-10-CM | POA: Diagnosis not present

## 2019-07-31 DIAGNOSIS — E669 Obesity, unspecified: Secondary | ICD-10-CM | POA: Diagnosis not present

## 2019-07-31 DIAGNOSIS — M62838 Other muscle spasm: Secondary | ICD-10-CM | POA: Diagnosis not present

## 2019-07-31 DIAGNOSIS — E782 Mixed hyperlipidemia: Secondary | ICD-10-CM | POA: Diagnosis not present

## 2019-07-31 DIAGNOSIS — Z79899 Other long term (current) drug therapy: Secondary | ICD-10-CM | POA: Diagnosis not present

## 2019-07-31 DIAGNOSIS — J309 Allergic rhinitis, unspecified: Secondary | ICD-10-CM | POA: Diagnosis not present

## 2019-07-31 DIAGNOSIS — M170 Bilateral primary osteoarthritis of knee: Secondary | ICD-10-CM | POA: Diagnosis not present

## 2019-07-31 DIAGNOSIS — K219 Gastro-esophageal reflux disease without esophagitis: Secondary | ICD-10-CM | POA: Diagnosis not present

## 2019-07-31 DIAGNOSIS — E538 Deficiency of other specified B group vitamins: Secondary | ICD-10-CM | POA: Diagnosis not present

## 2020-01-04 DIAGNOSIS — Z23 Encounter for immunization: Secondary | ICD-10-CM | POA: Diagnosis not present

## 2020-01-04 DIAGNOSIS — I10 Essential (primary) hypertension: Secondary | ICD-10-CM | POA: Diagnosis not present

## 2020-01-04 DIAGNOSIS — K219 Gastro-esophageal reflux disease without esophagitis: Secondary | ICD-10-CM | POA: Diagnosis not present

## 2020-01-04 DIAGNOSIS — M170 Bilateral primary osteoarthritis of knee: Secondary | ICD-10-CM | POA: Diagnosis not present

## 2020-01-04 DIAGNOSIS — J309 Allergic rhinitis, unspecified: Secondary | ICD-10-CM | POA: Diagnosis not present

## 2020-01-04 DIAGNOSIS — R69 Illness, unspecified: Secondary | ICD-10-CM | POA: Diagnosis not present

## 2020-01-04 DIAGNOSIS — E559 Vitamin D deficiency, unspecified: Secondary | ICD-10-CM | POA: Diagnosis not present

## 2020-01-04 DIAGNOSIS — J45909 Unspecified asthma, uncomplicated: Secondary | ICD-10-CM | POA: Diagnosis not present

## 2020-01-04 DIAGNOSIS — Z79899 Other long term (current) drug therapy: Secondary | ICD-10-CM | POA: Diagnosis not present

## 2020-01-04 DIAGNOSIS — E782 Mixed hyperlipidemia: Secondary | ICD-10-CM | POA: Diagnosis not present

## 2020-01-09 DIAGNOSIS — Z9181 History of falling: Secondary | ICD-10-CM | POA: Diagnosis not present

## 2020-01-09 DIAGNOSIS — Z139 Encounter for screening, unspecified: Secondary | ICD-10-CM | POA: Diagnosis not present

## 2020-01-09 DIAGNOSIS — N390 Urinary tract infection, site not specified: Secondary | ICD-10-CM | POA: Diagnosis not present

## 2020-04-05 DIAGNOSIS — I10 Essential (primary) hypertension: Secondary | ICD-10-CM | POA: Diagnosis not present

## 2020-04-05 DIAGNOSIS — E559 Vitamin D deficiency, unspecified: Secondary | ICD-10-CM | POA: Diagnosis not present

## 2020-04-05 DIAGNOSIS — Z79899 Other long term (current) drug therapy: Secondary | ICD-10-CM | POA: Diagnosis not present

## 2020-04-05 DIAGNOSIS — Z1331 Encounter for screening for depression: Secondary | ICD-10-CM | POA: Diagnosis not present

## 2020-04-05 DIAGNOSIS — J45909 Unspecified asthma, uncomplicated: Secondary | ICD-10-CM | POA: Diagnosis not present

## 2020-04-05 DIAGNOSIS — K219 Gastro-esophageal reflux disease without esophagitis: Secondary | ICD-10-CM | POA: Diagnosis not present

## 2020-04-05 DIAGNOSIS — R69 Illness, unspecified: Secondary | ICD-10-CM | POA: Diagnosis not present

## 2020-04-05 DIAGNOSIS — J309 Allergic rhinitis, unspecified: Secondary | ICD-10-CM | POA: Diagnosis not present

## 2020-04-05 DIAGNOSIS — E782 Mixed hyperlipidemia: Secondary | ICD-10-CM | POA: Diagnosis not present

## 2020-04-05 DIAGNOSIS — M170 Bilateral primary osteoarthritis of knee: Secondary | ICD-10-CM | POA: Diagnosis not present

## 2020-05-16 DIAGNOSIS — R69 Illness, unspecified: Secondary | ICD-10-CM | POA: Diagnosis not present

## 2020-05-16 DIAGNOSIS — H52223 Regular astigmatism, bilateral: Secondary | ICD-10-CM | POA: Diagnosis not present

## 2020-05-16 DIAGNOSIS — A5203 Syphilitic endocarditis: Secondary | ICD-10-CM | POA: Diagnosis not present

## 2020-05-16 DIAGNOSIS — H2513 Age-related nuclear cataract, bilateral: Secondary | ICD-10-CM | POA: Diagnosis not present

## 2020-12-16 ENCOUNTER — Other Ambulatory Visit: Payer: Self-pay | Admitting: Urology

## 2020-12-16 DIAGNOSIS — I722 Aneurysm of renal artery: Secondary | ICD-10-CM

## 2020-12-26 DIAGNOSIS — I480 Paroxysmal atrial fibrillation: Secondary | ICD-10-CM | POA: Diagnosis not present

## 2020-12-27 DIAGNOSIS — I249 Acute ischemic heart disease, unspecified: Secondary | ICD-10-CM | POA: Diagnosis not present

## 2020-12-27 DIAGNOSIS — I10 Essential (primary) hypertension: Secondary | ICD-10-CM | POA: Diagnosis not present

## 2020-12-27 DIAGNOSIS — I480 Paroxysmal atrial fibrillation: Secondary | ICD-10-CM | POA: Diagnosis not present

## 2020-12-28 DIAGNOSIS — I214 Non-ST elevation (NSTEMI) myocardial infarction: Secondary | ICD-10-CM

## 2020-12-28 DIAGNOSIS — I480 Paroxysmal atrial fibrillation: Secondary | ICD-10-CM

## 2020-12-28 DIAGNOSIS — R319 Hematuria, unspecified: Secondary | ICD-10-CM

## 2020-12-29 DIAGNOSIS — R319 Hematuria, unspecified: Secondary | ICD-10-CM | POA: Diagnosis not present

## 2020-12-29 DIAGNOSIS — I480 Paroxysmal atrial fibrillation: Secondary | ICD-10-CM | POA: Diagnosis not present

## 2020-12-29 DIAGNOSIS — I214 Non-ST elevation (NSTEMI) myocardial infarction: Secondary | ICD-10-CM | POA: Diagnosis not present

## 2020-12-30 DIAGNOSIS — I480 Paroxysmal atrial fibrillation: Secondary | ICD-10-CM | POA: Diagnosis not present

## 2020-12-30 DIAGNOSIS — R319 Hematuria, unspecified: Secondary | ICD-10-CM | POA: Diagnosis not present

## 2020-12-30 DIAGNOSIS — I214 Non-ST elevation (NSTEMI) myocardial infarction: Secondary | ICD-10-CM | POA: Diagnosis not present

## 2021-01-06 ENCOUNTER — Telehealth: Payer: Self-pay

## 2021-01-06 NOTE — Telephone Encounter (Signed)
NOTES SCANNED TO REFERRAL RJ 

## 2021-01-10 NOTE — Progress Notes (Signed)
Cardiology Office Note   Date:  01/13/2021   ID:  Taylor Arroyo, Taylor Arroyo [birth year], MRN [id number]  PCP:  Leonides Sake, MD  Cardiologist:   Gen Clagg Martinique, MD   Chief Complaint  Patient presents with   Coronary Artery Disease      History of Present Illness: Taylor Arroyo is a 75 y.o. female who is seen at the request of Dr Lisbeth Ply for evaluation of NSTEMI.  She has a history of HTN. She was admitted for a bladder biopsy to [facility]. Records from that hospitalization are not available other than an Echo report. The patient reports she was told she had a heart attack. Unclear if she had Afib. She states her cardiac enzymes were elevated. She denies any chest pain or dyspnea. States she never had cardiac problems before. Was DC on ASA, metoprolol and Crestor. She thinks something happened while she was under anesthesia. There was some mention of doing a stress test but she deferred.     Past Medical History:  Diagnosis Date   Hyperlipidemia    Hypertension     Past Surgical History:  Procedure Laterality Date   ABDOMINAL HYSTERECTOMY  1992   still has ovaries   CHOLECYSTECTOMY  2007     Current Outpatient Medications  Medication Sig Dispense Refill   acetaminophen (TYLENOL) 500 MG tablet Take 500 mg by mouth every 6 (six) hours as needed for pain.     ALBUTEROL SULFATE HFA IN Inhale into the lungs.     aspirin EC 81 MG tablet Take 1 tablet (81 mg total) by mouth daily. Swallow whole. 90 tablet 3   Calcium Ascorbate 500 MG TABS Take by mouth.     cyclobenzaprine (FLEXERIL) 10 MG tablet TAKE 1 TABLET THREE TIMES A DAY AS NEEDED FOR MUSCLE SPASMS 30 tablet 2   diazepam (VALIUM) 5 MG tablet Take 5 mg by mouth every 12 (twelve) hours as needed for anxiety.      enalapril (VASOTEC) 5 MG tablet Take 5 mg by mouth daily.     furosemide (LASIX) 20 MG tablet Take 20 mg by mouth 2 (two) times daily as needed.     metoprolol tartrate (LOPRESSOR) 25 MG  tablet Take 1 tablet (25 mg total) by mouth 2 (two) times daily. 180 tablet 3   omeprazole (PRILOSEC) 20 MG capsule Take 1 capsule (20 mg total) by mouth daily. 30 capsule 11   rosuvastatin (CRESTOR) 20 MG tablet Take 1 tablet (20 mg total) by mouth daily. 90 tablet 3   triamterene-hydrochlorothiazide (DYAZIDE) 37.5-25 MG per capsule Take 1 capsule by mouth every morning.     No current facility-administered medications for this visit.    Allergies:   Penicillins    Social History:  The patient  reports that she has never smoked. She has never used smokeless tobacco. She reports current alcohol use of about 1.0 standard drink per week. She reports that she does not use drugs.   Family History:  The patient's family history includes Cancer in her mother and sister; Cirrhosis in her father; Diabetes in her mother, sister, and sister; Heart disease in her mother and sister; Stroke in her mother.    ROS:  Please see the history of present illness.   Otherwise, review of systems are positive for none.   All other systems are reviewed and negative.    PHYSICAL EXAM: VS:  BP 122/60   Pulse 72   Ht 5' 6.5" (1.689 m)  Wt 208 lb 12.8 oz (94.7 kg)   SpO2 95%   BMI 33.20 kg/m  , BMI Body mass index is 33.2 kg/m. GEN: Well nourished, well developed, in no acute distress HEENT: normal Neck: no JVD, carotid bruits, or masses Cardiac: RRR; no murmurs, rubs, or gallops,no edema  Respiratory:  clear to auscultation bilaterally, normal work of breathing GI: soft, nontender, nondistended, + BS MS: no deformity or atrophy Skin: warm and dry, no rash Neuro:  Strength and sensation are intact Psych: euthymic mood, full affect   EKG:  EKG is ordered today. The ekg ordered today demonstrates NSR rate 72, LAD. Poor R wave progression in the anterior leads. Cannot rule out anterior infarct age undetermined. I have personally reviewed and interpreted this study.    Recent Labs: No results found  for requested labs within last 8760 hours.   Dated 01/04/20: cholesterol 251, triglycerides 200, HDL 49, LDL 165.  Dated 07/04/20: creatinine 1.03. K+ 4.3. ALT 15.  Dated 10/ 12/22: Hgb 10.2   Lipid Panel No results found for: CHOL, TRIG, HDL, CHOLHDL, VLDL, LDLCALC, LDLDIRECT    Wt Readings from Last 3 Encounters:  01/13/21 208 lb 12.8 oz (94.7 kg)  09/07/12 218 lb (98.9 kg)  06/14/12 215 lb (97.5 kg)      Other studies Reviewed: Additional studies/ records that were reviewed today include:   Echo 10/6//22: Normal EF 55-60% mild LVH. Impaired diastolic function. Mild LAE. Tr- mild AI.   ASSESSMENT AND PLAN:  1.  NSTEMI versus demand ischemia. Apparently had elevated troponins following a bladder biopsy. Unclear if she had arrhythmia as well. Will request hospital records. At least LV function was good by Echo. On ASA, Statin and metoprolol. Recommend further evaluation to assess her ischemic risk. We discussed options including stress testing, coronary CTA or cardiac cath. I would favor coronary CTA since it is noninvasive but I think superior to stress testing. Will arrange. 2. HTN controlled 3. HLD. On Statin now - will request lab results.    Current medicines are reviewed at length with the patient today.  The patient does not have concerns regarding medicines.  The following changes have been made:  no change  Labs/ tests ordered today include:   Orders Placed This Encounter  Procedures   CT CORONARY MORPH W/CTA COR W/SCORE W/CA W/CM &/OR WO/CM   EKG 12-Lead     Disposition:   FU with me  after above studies  Signed, Neri Samek Martinique, MD  01/13/2021 3:16 PM    [facility] [address], [place], [place], [address] Phone [phone], Fax [phone]

## 2021-01-13 ENCOUNTER — Ambulatory Visit: Payer: Self-pay | Admitting: Cardiology

## 2021-01-13 ENCOUNTER — Encounter: Payer: Self-pay | Admitting: Cardiology

## 2021-01-13 ENCOUNTER — Ambulatory Visit (INDEPENDENT_AMBULATORY_CARE_PROVIDER_SITE_OTHER): Payer: Medicare (Managed Care) | Admitting: Cardiology

## 2021-01-13 ENCOUNTER — Other Ambulatory Visit: Payer: Self-pay

## 2021-01-13 VITALS — BP 122/60 | HR 72 | Ht 66.5 in | Wt 208.8 lb

## 2021-01-13 DIAGNOSIS — I10 Essential (primary) hypertension: Secondary | ICD-10-CM | POA: Diagnosis not present

## 2021-01-13 DIAGNOSIS — E785 Hyperlipidemia, unspecified: Secondary | ICD-10-CM | POA: Diagnosis not present

## 2021-01-13 DIAGNOSIS — I214 Non-ST elevation (NSTEMI) myocardial infarction: Secondary | ICD-10-CM | POA: Diagnosis not present

## 2021-01-13 MED ORDER — OMEPRAZOLE 20 MG PO CPDR: 20.0000 mg | DELAYED_RELEASE_CAPSULE | Freq: Every day | ORAL | 11 refills | Status: DC

## 2021-01-13 MED ORDER — METOPROLOL TARTRATE 25 MG PO TABS: 25.0000 mg | ORAL_TABLET | Freq: Two times a day (BID) | ORAL | 3 refills | Status: DC

## 2021-01-13 MED ORDER — METOPROLOL TARTRATE 100 MG PO TABS: ORAL_TABLET | ORAL | 0 refills | Status: DC

## 2021-01-13 MED ORDER — ROSUVASTATIN CALCIUM 20 MG PO TABS: 20.0000 mg | ORAL_TABLET | Freq: Every day | ORAL | 3 refills | Status: DC

## 2021-01-13 MED ORDER — ASPIRIN EC 81 MG PO TBEC: 81.0000 mg | DELAYED_RELEASE_TABLET | Freq: Every day | ORAL | 3 refills | Status: DC

## 2021-01-13 NOTE — Patient Instructions (Addendum)
Medication Instructions:  Continue same medications *If you need a refill on your cardiac medications before your next appointment, please call your pharmacy*   Lab Work: Bmet   Have done 1 week before Coronary CT   Testing/Procedures: Coronary CT   will be scheduled after approved by insurance  Follow instructions below   Follow-Up: At [facility], you and your health needs are our priority.  As part of our continuing mission to provide you with exceptional heart care, we have created designated Provider Care Teams.  These Care Teams include your primary Cardiologist (physician) and Advanced Practice Providers (APPs -  Physician Assistants and Nurse Practitioners) who all work together to provide you with the care you need, when you need it.  We recommend signing up for the patient portal called "MyChart".  Sign up information is provided on this After Visit Summary.  MyChart is used to connect with patients for Virtual Visits (Telemedicine).  Patients are able to view lab/test results, encounter notes, upcoming appointments, etc.  Non-urgent messages can be sent to your provider as well.   To learn more about what you can do with MyChart, go to [email or web address].    Your next appointment:      The format for your next appointment:  Office   Provider:  Dr.Jordan       Your cardiac CT will be scheduled at one of the below locations:   [facility] [address] [place], [place] [address] [phone]  [facility] [address] [place], [place] [address] [phone]  If scheduled at [facility], please arrive at the [facility] main entrance (entrance A) of [facility] 30 minutes prior to test start time. You can use the FREE valet parking offered at the main entrance (encouraged to control the heart rate for the test) Proceed to the [facility] Radiology Department (first floor) to  check-in and test prep.  If scheduled at [facility], please arrive 15 mins early for check-in and test prep.  Please follow these instructions carefully (unless otherwise directed):   On the Night Before the Test: Be sure to Drink plenty of water. Do not consume any caffeinated/decaffeinated beverages or chocolate 12 hours prior to your test. Do not take any antihistamines 12 hours prior to your test.   On the Day of the Test: Drink plenty of water until 1 hour prior to the test. Do not eat any food 4 hours prior to the test. You may take your regular medications prior to the test.  Take metoprolol 100 mg two hours prior to test. HOLD Furosemide/ Triamterene/Hydrochlorothiazide morning of the test. FEMALES- please wear underwire-free bra if available, avoid dresses & tight clothing         After the Test: Drink plenty of water. After receiving IV contrast, you may experience a mild flushed feeling. This is normal. On occasion, you may experience a mild rash up to 24 hours after the test. This is not dangerous. If this occurs, you can take Benadryl 25 mg and increase your fluid intake. If you experience trouble breathing, this can be serious. If it is severe call [phone] IMMEDIATELY. If it is mild, please call our office.   Please allow 2-4 weeks for scheduling of routine cardiac CTs. Some insurance companies require a pre-authorization which may delay scheduling of this test.   For non-scheduling related questions, please contact the cardiac imaging nurse navigator should you  have any questions/concerns: Marchia Bond, Cardiac Imaging Nurse Navigator Gordy Clement, Cardiac Imaging Nurse Navigator [facility] Direct Office Dial: [phone]   For scheduling needs, including cancellations and rescheduling, please call [place], [phone].

## 2021-01-14 ENCOUNTER — Telehealth: Payer: Self-pay | Admitting: Cardiology

## 2021-01-14 MED ORDER — ROSUVASTATIN CALCIUM 20 MG PO TABS: 20.0000 mg | ORAL_TABLET | Freq: Every day | ORAL | 3 refills | Status: DC

## 2021-01-14 NOTE — Telephone Encounter (Signed)
*  STAT* If patient is at the pharmacy, call can be transferred to refill team.   1. Which medications need to be refilled? (please list name of each medication and dose if known)  Rosuvastatin  2. Which pharmacy/location (including street and city if local pharmacy) is medication to be sent to? [facility] [place]  3. Do they need a 30 day or 90 day supply? 90 days and refills

## 2021-02-12 ENCOUNTER — Other Ambulatory Visit: Payer: Self-pay

## 2021-02-26 LAB — BASIC METABOLIC PANEL
BUN/Creatinine Ratio: 18 (ref 12–28)
BUN: 18 mg/dL (ref 8–27)
CO2: 24 mmol/L (ref 20–29)
Calcium: 9.4 mg/dL (ref 8.7–10.3)
Chloride: 100 mmol/L (ref 96–106)
Creatinine, Ser: 0.98 mg/dL (ref 0.57–1.00)
Glucose: 101 mg/dL — ABNORMAL HIGH (ref 70–99)
Potassium: 4.3 mmol/L (ref 3.5–5.2)
Sodium: 138 mmol/L (ref 134–144)
eGFR: 61 mL/min/{1.73_m2} (ref >59)

## 2021-03-03 ENCOUNTER — Ambulatory Visit: Payer: Self-pay | Admitting: Cardiology

## 2021-03-11 ENCOUNTER — Telehealth (HOSPITAL_COMMUNITY): Payer: Self-pay | Admitting: *Deleted

## 2021-03-11 NOTE — Telephone Encounter (Signed)
Reaching out to patient to offer assistance regarding upcoming cardiac imaging study; pt verbalizes understanding of appt date/time, parking situation and where to check in, pre-test NPO status and medications ordered, and verified current allergies; name and call back number provided for further questions should they arise  Gordy Clement RN Navigator Cardiac Imaging [facility] [phone] office [phone] cell  Patient to take 100mg  metoprolol tartrate two hours prior to cardiac CT scan. She is aware to arrive at 12:30 for her 1pm scan.

## 2021-03-12 ENCOUNTER — Ambulatory Visit (HOSPITAL_COMMUNITY): Admission: RE | Admit: 2021-03-12 | Payer: Medicare (Managed Care) | Source: Ambulatory Visit

## 2021-03-27 ENCOUNTER — Telehealth (HOSPITAL_COMMUNITY): Payer: Self-pay | Admitting: Emergency Medicine

## 2021-03-27 NOTE — Telephone Encounter (Signed)
Unable to leave vm for callback Marchia Bond RN Navigator Cardiac Imaging [facility] [phone] Office  [phone] Cell

## 2021-03-28 ENCOUNTER — Ambulatory Visit (HOSPITAL_COMMUNITY): Admission: RE | Admit: 2021-03-28 | Payer: Medicare HMO | Source: Ambulatory Visit

## 2021-03-29 NOTE — Progress Notes (Signed)
Cardiology Office Note   Date:  04/07/2021   ID:  Taylor Arroyo, Taylor Arroyo [birth year], MRN [id number]  PCP:  Leonides Sake, MD  Cardiologist:   Jheri Mitter Martin[place], MD   Chief Complaint  Patient presents with   Atrial Fibrillation      History of Present Illness: Taylor Arroyo is a 76 y.o. female who is seen for follow up coronary CTA.   She has a history of HTN. She was admitted for a bladder biopsy to [facility]. She went into rapid Afib. Apparently had some chest burning. Cardiac enzymes were elevated. Troponins were 0.15>0.21>0.31. She denies any chest pain or dyspnea. States she never had cardiac problems before. Echo was noted below. Was DC on ASA, metoprolol and Crestor. She thinks something happened while she was under anesthesia. There was some mention of doing a stress test but she deferred. When we saw her last a coronary CTA was recommended for ischemic evaluation. This was scheduled but then she cancelled x 2. She states her insurance wouldn't cover it. She has since changed insurance.  She does note continued symptoms of chest pain like a needle in her heart. Last week she was washing dishes and became very lightheaded and felt like she might pass out. She sat down and it resolved in 15-20 minutes. She notes occasional skipped beats. She does have some incontinence but no bleeding. No dyspnea.     Past Medical History:  Diagnosis Date   Hyperlipidemia    Hypertension     Past Surgical History:  Procedure Laterality Date   ABDOMINAL HYSTERECTOMY  1992   still has ovaries   CHOLECYSTECTOMY  2007     Current Outpatient Medications  Medication Sig Dispense Refill   acetaminophen (TYLENOL) 500 MG tablet Take 500 mg by mouth every 6 (six) hours as needed for pain.     ALBUTEROL SULFATE HFA IN Inhale into the lungs.     aspirin EC 81 MG tablet Take 1 tablet (81 mg total) by mouth daily. Swallow whole. 90 tablet 3   Calcium Ascorbate 500 MG TABS Take  by mouth.     diazepam (VALIUM) 5 MG tablet Take 5 mg by mouth every 12 (twelve) hours as needed for anxiety.      enalapril (VASOTEC) 20 MG tablet Take 20 mg by mouth daily.     escitalopram (LEXAPRO) 10 MG tablet Take 10 mg by mouth daily.     furosemide (LASIX) 40 MG tablet Take 40 mg by mouth.     ipratropium (ATROVENT) 0.06 % nasal spray Place into both nostrils.     metoprolol tartrate (LOPRESSOR) 25 MG tablet Take 1 tablet (25 mg total) by mouth 2 (two) times daily. 180 tablet 3   Omega-3 Fatty Acids (FISH OIL) 1000 MG CAPS Take 1,000 mg by mouth daily.     omeprazole (PRILOSEC) 20 MG capsule Take 1 capsule (20 mg total) by mouth daily. 30 capsule 11   rosuvastatin (CRESTOR) 20 MG tablet Take 1 tablet (20 mg total) by mouth daily. 90 tablet 3   triamterene-hydrochlorothiazide (DYAZIDE) 37.5-25 MG per capsule Take 1 capsule by mouth every morning.     cyclobenzaprine (FLEXERIL) 10 MG tablet TAKE 1 TABLET THREE TIMES A DAY AS NEEDED FOR MUSCLE SPASMS (Patient not taking: Reported on 04/07/2021) 30 tablet 2   enalapril (VASOTEC) 5 MG tablet Take 5 mg by mouth daily. (Patient not taking: Reported on 04/07/2021)     furosemide (LASIX) 20 MG tablet  Take 20 mg by mouth 2 (two) times daily as needed. (Patient not taking: Reported on 04/07/2021)     No current facility-administered medications for this visit.    Allergies:   Penicillins    Social History:  The patient  reports that she has never smoked. She has never used smokeless tobacco. She reports current alcohol use of about 1.0 standard drink per week. She reports that she does not use drugs.   Family History:  The patient's family history includes Cancer in her mother and sister; Cirrhosis in her father; Diabetes in her mother, sister, and sister; Heart disease in her mother and sister; Stroke in her mother.    ROS:  Please see the history of present illness.   Otherwise, review of systems are positive for none.   All other systems are  reviewed and negative.    PHYSICAL EXAM: VS:  BP 120/68    Pulse 66    Ht 5' 6.05" (1.678 m)    Wt 209 lb 12.8 oz (95.2 kg)    SpO2 96%    BMI 33.81 kg/m  , BMI Body mass index is 33.81 kg/m. GEN: Well nourished, well developed, in no acute distress HEENT: normal Neck: no JVD, carotid bruits, or masses Cardiac: RRR; no murmurs, rubs, or gallops,no edema  Respiratory:  clear to auscultation bilaterally, normal work of breathing GI: soft, nontender, nondistended, + BS MS: no deformity or atrophy Skin: warm and dry, no rash Neuro:  Strength and sensation are intact Psych: euthymic mood, full affect   EKG:  EKG is not ordered today.     Recent Labs: 02/25/2021: BUN 18; Creatinine, Ser 0.98; Potassium 4.3; Sodium 138   Dated 01/04/20: cholesterol 251, triglycerides 200, HDL 49, LDL 165.  Dated 07/04/20: creatinine 1.03. K+ 4.3. ALT 15.  Dated 10/ 12/22: Hgb 10.2   Lipid Panel No results found for: CHOL, TRIG, HDL, CHOLHDL, VLDL, LDLCALC, LDLDIRECT    Wt Readings from Last 3 Encounters:  04/07/21 209 lb 12.8 oz (95.2 kg)  01/13/21 208 lb 12.8 oz (94.7 kg)  09/07/12 218 lb (98.9 kg)      Other studies Reviewed: Additional studies/ records that were reviewed today include:   Echo 10/6//22: Normal EF 55-60% mild LVH. Impaired diastolic function. Mild LAE. Tr- mild AI.   ASSESSMENT AND PLAN:  1.  NSTEMI versus demand ischemia. She had elevated troponins following a bladder biopsy. This was in the setting of Afib with RVR and anemia so is likely demand ischemia.  At least LV function was good by Echo. On ASA, Statin and metoprolol. She is still having some atypical chest pain. Recommend further evaluation to assess her ischemic risk. We discussed options including stress testing, coronary CTA .I would favor coronary CTA. Will reorder. If insurance denies will need to do a Myoview instead.  2. HTN controlled 3. HLD. On Statin now. 4. Paroxysmal Afib. Recent episode of near  syncope. Will arrange for a 14 day event monitor. If she does have recurrent Afib will need to consider anticoagulation. [place] Vasc score of 3.   Current medicines are reviewed at length with the patient today.  The patient does not have concerns regarding medicines.  The following changes have been made:  no change  Labs/ tests ordered today include:   Orders Placed This Encounter  Procedures   CT CORONARY MORPH W/CTA COR W/SCORE W/CA W/CM &/OR WO/CM   Basic metabolic panel   LONG TERM MONITOR (3-14 DAYS)  Disposition:   FU with me  after above studies  Signed, Cailan General Martinique, MD  04/07/2021 1:26 PM    [facility] [address] [place], [place], [address] Phone [phone], Fax [phone]

## 2021-04-07 ENCOUNTER — Ambulatory Visit (INDEPENDENT_AMBULATORY_CARE_PROVIDER_SITE_OTHER): Payer: Medicare HMO

## 2021-04-07 ENCOUNTER — Ambulatory Visit: Payer: Medicare HMO | Admitting: Cardiology

## 2021-04-07 ENCOUNTER — Other Ambulatory Visit: Payer: Self-pay

## 2021-04-07 ENCOUNTER — Encounter: Payer: Self-pay | Admitting: Cardiology

## 2021-04-07 VITALS — BP 120/68 | HR 66 | Ht 66.05 in | Wt 209.8 lb

## 2021-04-07 DIAGNOSIS — E785 Hyperlipidemia, unspecified: Secondary | ICD-10-CM | POA: Diagnosis not present

## 2021-04-07 DIAGNOSIS — I10 Essential (primary) hypertension: Secondary | ICD-10-CM

## 2021-04-07 DIAGNOSIS — R079 Chest pain, unspecified: Secondary | ICD-10-CM | POA: Diagnosis not present

## 2021-04-07 DIAGNOSIS — R778 Other specified abnormalities of plasma proteins: Secondary | ICD-10-CM | POA: Diagnosis not present

## 2021-04-07 DIAGNOSIS — I480 Paroxysmal atrial fibrillation: Secondary | ICD-10-CM

## 2021-04-07 DIAGNOSIS — R7989 Other specified abnormal findings of blood chemistry: Secondary | ICD-10-CM

## 2021-04-07 NOTE — Progress Notes (Unsigned)
Enrolled for [employer] to mail a ZIO XT long term holter monitor to the patients address on file.  Letter with instructions mailed to patient. 

## 2021-04-07 NOTE — Patient Instructions (Addendum)
Medication Instructions:  Continue same medications *If you need a refill on your cardiac medications before your next appointment, please call your pharmacy*   Lab Work: Bmet today   Testing/Procedures: Coronary CT will be scheduled after approved by insurance   Follow instructions below    Follow-Up: At [facility], you and your health needs are our priority.  As part of our continuing mission to provide you with exceptional heart care, we have created designated Provider Care Teams.  These Care Teams include your primary Cardiologist (physician) and Advanced Practice Providers (APPs -  Physician Assistants and Nurse Practitioners) who all work together to provide you with the care you need, when you need it.  We recommend signing up for the patient portal called "MyChart".  Sign up information is provided on this After Visit Summary.  MyChart is used to connect with patients for Virtual Visits (Telemedicine).  Patients are able to view lab/test results, encounter notes, upcoming appointments, etc.  Non-urgent messages can be sent to your provider as well.   To learn more about what you can do with MyChart, go to [email or web address].      Your next appointment:  Tuesday 06/03/21 at 1:40 pm    The format for your next appointment: Office   Provider:  Dr.Jordan      Your cardiac CT will be scheduled at one of the below locations:   [facility] [address] [place], [place] [address] [phone]  [facility] [address] [place], [place] [address] [phone]  If scheduled at [facility], please arrive at the [facility] main entrance (entrance A) of [facility] 30 minutes prior to test start time. You can use the FREE valet parking offered at the main entrance (encouraged to control the heart rate for the test) Proceed to the [facility] Radiology Department (first floor) to  check-in and test prep.  If scheduled at [facility], please arrive 15 mins early for check-in and test prep.  Please follow these instructions carefully (unless otherwise directed):   On the Night Before the Test: Be sure to Drink plenty of water. Do not consume any caffeinated/decaffeinated beverages or chocolate 12 hours prior to your test. Do not take any antihistamines 12 hours prior to your test.   On the Day of the Test: Drink plenty of water until 1 hour prior to the test. Do not eat any food 4 hours prior to the test. You may take your regular medications prior to the test.  Take metoprolol 100 mg two hours prior to test. HOLD Furosemide/Hydrochlorothiazide morning of the test. FEMALES- please wear underwire-free bra if available, avoid dresses & tight clothing         After the Test: Drink plenty of water. After receiving IV contrast, you may experience a mild flushed feeling. This is normal. On occasion, you may experience a mild rash up to 24 hours after the test. This is not dangerous. If this occurs, you can take Benadryl 25 mg and increase your fluid intake. If you experience trouble breathing, this can be serious. If it is severe call 911 IMMEDIATELY. If it is mild, please call our office.  We will call to schedule your test 2-4 weeks out understanding that some insurance companies will need an authorization prior to the service being performed.   For non-scheduling related questions, please contact the cardiac imaging nurse navigator should you have any questions/concerns: Clarise Cruz  Juleen China, Cardiac Imaging Nurse Navigator Gordy Clement, Cardiac Imaging Nurse Navigator [facility] Direct Office Dial: [phone]   For scheduling needs, including cancellations and rescheduling, please call [place], [phone].

## 2021-04-08 DIAGNOSIS — Z139 Encounter for screening, unspecified: Secondary | ICD-10-CM | POA: Diagnosis not present

## 2021-04-08 DIAGNOSIS — E669 Obesity, unspecified: Secondary | ICD-10-CM | POA: Diagnosis not present

## 2021-04-08 DIAGNOSIS — F419 Anxiety disorder, unspecified: Secondary | ICD-10-CM | POA: Diagnosis not present

## 2021-04-08 DIAGNOSIS — G4710 Hypersomnia, unspecified: Secondary | ICD-10-CM | POA: Diagnosis not present

## 2021-04-08 DIAGNOSIS — D649 Anemia, unspecified: Secondary | ICD-10-CM | POA: Diagnosis not present

## 2021-04-08 DIAGNOSIS — R5383 Other fatigue: Secondary | ICD-10-CM | POA: Diagnosis not present

## 2021-04-08 DIAGNOSIS — I4891 Unspecified atrial fibrillation: Secondary | ICD-10-CM | POA: Diagnosis not present

## 2021-04-08 DIAGNOSIS — Z6835 Body mass index (BMI) 35.0-35.9, adult: Secondary | ICD-10-CM | POA: Diagnosis not present

## 2021-04-10 LAB — BASIC METABOLIC PANEL
BUN/Creatinine Ratio: 22 (ref 12–28)
BUN: 22 mg/dL (ref 8–27)
CO2: 22 mmol/L (ref 20–29)
Calcium: 9.4 mg/dL (ref 8.7–10.3)
Chloride: 97 mmol/L (ref 96–106)
Creatinine, Ser: 1 mg/dL (ref 0.57–1.00)
Glucose: 76 mg/dL (ref 70–99)
Potassium: 4.4 mmol/L (ref 3.5–5.2)
Sodium: 138 mmol/L (ref 134–144)
eGFR: 59 mL/min/{1.73_m2} — ABNORMAL LOW (ref >59)

## 2021-04-19 DIAGNOSIS — R778 Other specified abnormalities of plasma proteins: Secondary | ICD-10-CM

## 2021-04-19 DIAGNOSIS — R079 Chest pain, unspecified: Secondary | ICD-10-CM

## 2021-04-19 DIAGNOSIS — I10 Essential (primary) hypertension: Secondary | ICD-10-CM | POA: Diagnosis not present

## 2021-04-19 DIAGNOSIS — E785 Hyperlipidemia, unspecified: Secondary | ICD-10-CM

## 2021-04-19 DIAGNOSIS — I480 Paroxysmal atrial fibrillation: Secondary | ICD-10-CM

## 2021-05-06 ENCOUNTER — Telehealth: Payer: Self-pay | Admitting: Cardiology

## 2021-05-06 NOTE — Telephone Encounter (Signed)
Patient states she is returning a call from today. I did not see any notes. 

## 2021-05-07 NOTE — Telephone Encounter (Signed)
NOTED

## 2021-05-13 DIAGNOSIS — J189 Pneumonia, unspecified organism: Secondary | ICD-10-CM | POA: Diagnosis not present

## 2021-05-13 DIAGNOSIS — Z6835 Body mass index (BMI) 35.0-35.9, adult: Secondary | ICD-10-CM | POA: Diagnosis not present

## 2021-05-13 DIAGNOSIS — F419 Anxiety disorder, unspecified: Secondary | ICD-10-CM | POA: Diagnosis not present

## 2021-05-21 DIAGNOSIS — E785 Hyperlipidemia, unspecified: Secondary | ICD-10-CM | POA: Diagnosis not present

## 2021-05-21 DIAGNOSIS — R079 Chest pain, unspecified: Secondary | ICD-10-CM | POA: Diagnosis not present

## 2021-05-21 DIAGNOSIS — R778 Other specified abnormalities of plasma proteins: Secondary | ICD-10-CM | POA: Diagnosis not present

## 2021-05-21 DIAGNOSIS — I480 Paroxysmal atrial fibrillation: Secondary | ICD-10-CM | POA: Diagnosis not present

## 2021-05-27 DIAGNOSIS — Z6833 Body mass index (BMI) 33.0-33.9, adult: Secondary | ICD-10-CM | POA: Diagnosis not present

## 2021-05-27 DIAGNOSIS — F419 Anxiety disorder, unspecified: Secondary | ICD-10-CM | POA: Diagnosis not present

## 2021-05-27 DIAGNOSIS — D649 Anemia, unspecified: Secondary | ICD-10-CM | POA: Diagnosis not present

## 2021-05-27 DIAGNOSIS — R059 Cough, unspecified: Secondary | ICD-10-CM | POA: Diagnosis not present

## 2021-05-29 NOTE — Progress Notes (Deleted)
?  ?Cardiology Office Note ? ? ?Date:  05/29/2021  ? ?ID:  Taylor Arroyo, DOB May 15, 1945, MRN [id number] ? ?PCP:  Leonides Sake, MD  ?Cardiologist:   Rowene Suto Martinique, MD  ? ?No chief complaint on file. ? ? ?  ?History of Present Illness: ?Taylor Arroyo is a 76 y.o. female who is seen for follow up coronary CTA.   She has a history of HTN. She was admitted for a bladder biopsy to [facility]. She went into rapid Afib. Apparently had some chest burning. Cardiac enzymes were elevated. Troponins were 0.15>0.21>0.31. She denies any chest pain or dyspnea. States she never had cardiac problems before. Echo was noted below. Was DC on ASA, metoprolol and Crestor. She thinks something happened while she was under anesthesia. There was some mention of doing a stress test but she deferred. When we saw her last a coronary CTA was recommended for ischemic evaluation. This was scheduled but then she cancelled x 2. She states her insurance wouldn't cover it. She has since changed insurance. ? ?On her last visit she noted some palpitations and periods of lightheadedness. Event monitor was benign with no Afib noted.  ? ?She does note continued symptoms of chest pain like a needle in her heart. Last week she was washing dishes and became very lightheaded and felt like she might pass out. She sat down and it resolved in 15-20 minutes. She notes occasional skipped beats. She does have some incontinence but no bleeding. No dyspnea.  ? ? ? ?Past Medical History:  ?Diagnosis Date  ? Hyperlipidemia   ? Hypertension   ? ? ?Past Surgical History:  ?Procedure Laterality Date  ? ABDOMINAL HYSTERECTOMY  1992  ? still has ovaries  ? CHOLECYSTECTOMY  2007  ? ? ? ?Current Outpatient Medications  ?Medication Sig Dispense Refill  ? acetaminophen (TYLENOL) 500 MG tablet Take 500 mg by mouth every 6 (six) hours as needed for pain.    ? ALBUTEROL SULFATE HFA IN Inhale into the lungs.    ? aspirin EC 81 MG tablet Take 1 tablet (81 mg  total) by mouth daily. Swallow whole. 90 tablet 3  ? Calcium Ascorbate 500 MG TABS Take by mouth.    ? cyclobenzaprine (FLEXERIL) 10 MG tablet TAKE 1 TABLET THREE TIMES A DAY AS NEEDED FOR MUSCLE SPASMS (Patient not taking: Reported on 04/07/2021) 30 tablet 2  ? diazepam (VALIUM) 5 MG tablet Take 5 mg by mouth every 12 (twelve) hours as needed for anxiety.     ? enalapril (VASOTEC) 20 MG tablet Take 20 mg by mouth daily.    ? enalapril (VASOTEC) 5 MG tablet Take 5 mg by mouth daily. (Patient not taking: Reported on 04/07/2021)    ? escitalopram (LEXAPRO) 10 MG tablet Take 10 mg by mouth daily.    ? furosemide (LASIX) 20 MG tablet Take 20 mg by mouth 2 (two) times daily as needed. (Patient not taking: Reported on 04/07/2021)    ? furosemide (LASIX) 40 MG tablet Take 40 mg by mouth.    ? ipratropium (ATROVENT) 0.06 % nasal spray Place into both nostrils.    ? metoprolol tartrate (LOPRESSOR) 25 MG tablet Take 1 tablet (25 mg total) by mouth 2 (two) times daily. 180 tablet 3  ? Omega-3 Fatty Acids (FISH OIL) 1000 MG CAPS Take 1,000 mg by mouth daily.    ? omeprazole (PRILOSEC) 20 MG capsule Take 1 capsule (20 mg total) by mouth daily. 30 capsule 11  ?  rosuvastatin (CRESTOR) 20 MG tablet Take 1 tablet (20 mg total) by mouth daily. 90 tablet 3  ? triamterene-hydrochlorothiazide (DYAZIDE) 37.5-25 MG per capsule Take 1 capsule by mouth every morning.    ? ?No current facility-administered medications for this visit.  ? ? ?Allergies:   Penicillins  ? ? ?Social History:  The patient  reports that she has never smoked. She has never used smokeless tobacco. She reports current alcohol use of about 1.0 standard drink per week. She reports that she does not use drugs.  ? ?Family History:  The patient's family history includes Cancer in her mother and sister; Cirrhosis in her father; Diabetes in her mother, sister, and sister; Heart disease in her mother and sister; Stroke in her mother.  ? ? ?ROS:  Please see the history of present  illness.   Otherwise, review of systems are positive for none.   All other systems are reviewed and negative.  ? ? ?PHYSICAL EXAM: ?VS:  There were no vitals taken for this visit. , BMI There is no height or weight on file to calculate BMI. ?GEN: Well nourished, well developed, in no acute distress ?HEENT: normal ?Neck: no JVD, carotid bruits, or masses ?Cardiac: RRR; no murmurs, rubs, or gallops,no edema  ?Respiratory:  clear to auscultation bilaterally, normal work of breathing ?GI: soft, nontender, nondistended, + BS ?MS: no deformity or atrophy ?Skin: warm and dry, no rash ?Neuro:  Strength and sensation are intact ?Psych: euthymic mood, full affect ? ? ?EKG:  EKG is not ordered today. ? ? ? ? ?Recent Labs: ?04/07/2021: BUN 22; Creatinine, Ser 1.00; Potassium 4.4; Sodium 138  ? ?Dated 01/04/20: cholesterol 251, triglycerides 200, HDL 49, LDL 165.  ?Dated 07/04/20: creatinine 1.03. K+ 4.3. ALT 15.  ?Dated 10/ 12/22: Hgb 10.2 ?Dated 05/27/21/ glucose 123, Hgb 11.1. otherwise CBC, CMET and TSH normal. ? ? ?Lipid Panel ?No results found for: CHOL, TRIG, HDL, CHOLHDL, VLDL, LDLCALC, LDLDIRECT ?  ? ?Wt Readings from Last 3 Encounters:  ?04/07/21 209 lb 12.8 oz (95.2 kg)  ?01/13/21 208 lb 12.8 oz (94.7 kg)  ?09/07/12 218 lb (98.9 kg)  ?  ? ? ?Other studies Reviewed: ?Additional studies/ records that were reviewed today include:  ? ?Echo 10/6//22: Normal EF 55-60% mild LVH. Impaired diastolic function. Mild LAE. Tr- mild AI. ? ?Event monitor 05/21/21: Study Highlights ? ?  ?Normal sinus rhythm ?2 episodes of SVT longest lasting 16 beats. ?2 episodes of NSVT longest lasting 12 beats ?No Afib noted. ?  ?  ?Patch Wear Time:  14 days and 0 hours (2023-01-28T13:21:12-498 to 2023-02-11T13:21:14-0500) ?  ?ASSESSMENT AND PLAN: ? ?1.  NSTEMI versus demand ischemia. She had elevated troponins following a bladder biopsy. This was in the setting of Afib with RVR and anemia so is likely demand ischemia.  At least LV function was good by  Echo. On ASA, Statin and metoprolol. She is still having some atypical chest pain. Recommend further evaluation to assess her ischemic risk. We discussed options including stress testing, coronary CTA .I would favor coronary CTA. Will reorder. If insurance denies will need to do a Myoview instead.  ?2. HTN controlled ?3. HLD. On Statin now. ?4. Paroxysmal Afib. Recent episode of near syncope. Will arrange for a 14 day event monitor. If she does have recurrent Afib will need to consider anticoagulation. [place] Vasc score of 3. ? ? ?Current medicines are reviewed at length with the patient today.  The patient does not have concerns regarding medicines. ? ?  The following changes have been made:  no change ? ?Labs/ tests ordered today include:  ? ?No orders of the defined types were placed in this encounter. ? ? ? ?Disposition:   FU with me  after above studies ? ?Signed, ?Makell Drohan Martinique, MD  ?05/29/2021 7:55 AM    ?[facility] ?[address] [place], [place], [address] ?Phone [phone], Fax [phone] ? ? ?

## 2021-06-02 ENCOUNTER — Telehealth: Payer: Self-pay | Admitting: Cardiology

## 2021-06-02 NOTE — Telephone Encounter (Signed)
Returned call to patient who states that she was calling in regard to scheduling and getting insurance approval for her Coronary CTA. Patient states she has not heard back in regard to this. Patient had follow up scheduled for tomorrow 3/14 with Dr. Martinique but patient had to cancel due to an appointment with her husband. Patient rescheduled appointment to 4/24. Advised patient I would forward message to Dr. Doug Sou nurse as well as our pre cert department to check on insurance authorization. Patient aware and verbalized understanding.  ?

## 2021-06-02 NOTE — Telephone Encounter (Signed)
Patient wants to know if he have heard from her insurance company about approving her stress test. I don't see an order for one in the active tab. There is an order in the finalized request tab though. Please advise.  ?

## 2021-06-03 ENCOUNTER — Ambulatory Visit: Payer: Medicare HMO | Admitting: Cardiology

## 2021-06-03 NOTE — Telephone Encounter (Signed)
Called patient no answer.No voicemail.Unable to leave a message. ?

## 2021-06-04 NOTE — Telephone Encounter (Signed)
Spoke to patient advised I received a message her insurance approved cardiac ct.Approval is good until 09/2021.Cardiac CT dept called a message left to reschedule cardiac ct before her follow up with Dr.Jordan 07/14/21. ?

## 2021-07-10 NOTE — Progress Notes (Signed)
?  ?Cardiology Office Note ? ? ?Date:  07/14/2021  ? ?ID:  Taylor Arroyo, DOB 1946/01/08, MRN [id number] ? ?PCP:  Leonides Sake, MD  ?Cardiologist:   Krystena Reitter Martinique, MD  ? ?No chief complaint on file. ? ? ?  ?History of Present Illness: ?Taylor Arroyo is a 76 y.o. female who is seen for follow up.   She has a history of HTN. She was admitted for a bladder biopsy to [facility] in October.  She went into rapid Afib. Apparently had some chest burning. Cardiac enzymes were elevated. Troponins were 0.15>0.21>0.31. She denied any chest pain or dyspnea. States she never had cardiac problems before. Echo was noted below. Was DC on ASA, metoprolol and Crestor. She thinks something happened while she was under anesthesia. There was some mention of doing a stress test but she deferred. When we saw her last a coronary CTA was recommended for ischemic evaluation. This was scheduled but then she cancelled x 2. She states her insurance wouldn't cover it. She has since changed insurance. On her last visit her insurance had changed. We revisited doing a coronary CTA but this was never done even though apparently approved by her insurance.  Event monitor in March showed no recurrent Afib.  ? ?She does note symptoms of heart burn at night. Takes prilosec but it isn't helping. Complains of swelling in her legs. Takes lasix 40 mg daily.  ? ? ? ?Past Medical History:  ?Diagnosis Date  ? Hyperlipidemia   ? Hypertension   ? ? ?Past Surgical History:  ?Procedure Laterality Date  ? ABDOMINAL HYSTERECTOMY  1992  ? still has ovaries  ? CHOLECYSTECTOMY  2007  ? ? ? ?Current Outpatient Medications  ?Medication Sig Dispense Refill  ? acetaminophen (TYLENOL) 500 MG tablet Take 500 mg by mouth every 6 (six) hours as needed for pain.    ? ALBUTEROL SULFATE HFA IN Inhale into the lungs.    ? aspirin EC 81 MG tablet Take 1 tablet (81 mg total) by mouth daily. Swallow whole. 90 tablet 3  ? Calcium Ascorbate 500 MG TABS Take by  mouth.    ? cyclobenzaprine (FLEXERIL) 10 MG tablet TAKE 1 TABLET THREE TIMES A DAY AS NEEDED FOR MUSCLE SPASMS 30 tablet 2  ? diazepam (VALIUM) 5 MG tablet Take 5 mg by mouth every 12 (twelve) hours as needed for anxiety.     ? enalapril (VASOTEC) 20 MG tablet Take 20 mg by mouth daily.    ? escitalopram (LEXAPRO) 10 MG tablet Take 10 mg by mouth daily.    ? furosemide (LASIX) 40 MG tablet Take 40 mg by mouth.    ? ipratropium (ATROVENT) 0.06 % nasal spray Place into both nostrils.    ? metoprolol tartrate (LOPRESSOR) 25 MG tablet Take 1 tablet (25 mg total) by mouth 2 (two) times daily. 180 tablet 3  ? Omega-3 Fatty Acids (FISH OIL) 1000 MG CAPS Take 1,000 mg by mouth daily.    ? omeprazole (PRILOSEC) 20 MG capsule Take 1 capsule (20 mg total) by mouth daily. 30 capsule 11  ? rosuvastatin (CRESTOR) 20 MG tablet Take 1 tablet (20 mg total) by mouth daily. 90 tablet 3  ? triamterene-hydrochlorothiazide (DYAZIDE) 37.5-25 MG per capsule Take 1 capsule by mouth every morning.    ? venlafaxine XR (EFFEXOR-XR) 75 MG 24 hr capsule Take 75 mg by mouth daily.    ? enalapril (VASOTEC) 5 MG tablet Take 5 mg by mouth daily. (Patient  not taking: Reported on 04/07/2021)    ? furosemide (LASIX) 20 MG tablet Take 2 tablets (40 mg total) by mouth daily. (Patient not taking: Reported on 07/14/2021) 30 tablet   ? ?No current facility-administered medications for this visit.  ? ? ?Allergies:   Penicillins  ? ? ?Social History:  The patient  reports that she has never smoked. She has never used smokeless tobacco. She reports current alcohol use of about 1.0 standard drink per week. She reports that she does not use drugs.  ? ?Family History:  The patient's family history includes Cancer in her mother and sister; Cirrhosis in her father; Diabetes in her mother, sister, and sister; Heart disease in her mother and sister; Stroke in her mother.  ? ? ?ROS:  Please see the history of present illness.   Otherwise, review of systems are positive  for none.   All other systems are reviewed and negative.  ? ? ?PHYSICAL EXAM: ?VS:  BP (!) 130/50 (BP Location: Left Arm)   Pulse 73   Ht 5' 6.5" (1.689 m)   Wt 219 lb 6.4 oz (99.5 kg)   SpO2 97%   BMI 34.88 kg/m?  , BMI Body mass index is 34.88 kg/m?. ?GEN: Well nourished, well developed, in no acute distress ?HEENT: normal ?Neck: no JVD, carotid bruits, or masses ?Cardiac: RRR; no murmurs, rubs, or gallops,no edema  ?Respiratory:  clear to auscultation bilaterally, normal work of breathing ?GI: soft, nontender, nondistended, + BS ?MS: no deformity or atrophy ?Skin: warm and dry, no rash ?Neuro:  Strength and sensation are intact ?Psych: euthymic mood, full affect ? ? ?EKG:  EKG is not ordered today. ? ? ? ? ?Recent Labs: ?04/07/2021: BUN 22; Creatinine, Ser 1.00; Potassium 4.4; Sodium 138  ? ?Dated 01/04/20: cholesterol 251, triglycerides 200, HDL 49, LDL 165.  ?Dated 07/04/20: creatinine 1.03. K+ 4.3. ALT 15.  ?Dated 10/ 12/22: Hgb 10.2 ?Dated 05/27/21: Hgb 11.1. otherwise CMET, CBC and TSH normal. ? ? ?Lipid Panel ?No results found for: CHOL, TRIG, HDL, CHOLHDL, VLDL, LDLCALC, LDLDIRECT ?  ? ?Wt Readings from Last 3 Encounters:  ?07/14/21 219 lb 6.4 oz (99.5 kg)  ?04/07/21 209 lb 12.8 oz (95.2 kg)  ?01/13/21 208 lb 12.8 oz (94.7 kg)  ?  ? ? ?Other studies Reviewed: ?Additional studies/ records that were reviewed today include:  ? ?Echo 10/6//22: Normal EF 55-60% mild LVH. Impaired diastolic function. Mild LAE. Tr- mild AI. ? ?Study Highlights ? ?  ?Normal sinus rhythm ?2 episodes of SVT longest lasting 16 beats. ?2 episodes of NSVT longest lasting 12 beats ?No Afib noted. ?  ?  ?Patch Wear Time:  14 days and 0 hours (2023-01-28T13:21:12-498 to 2023-02-11T13:21:14-0500) ?  ?Patient had a min HR of 56 bpm, max HR of 158 bpm, and avg HR of 77 bpm. Predominant underlying rhythm was Sinus Rhythm. 2 Ventricular Tachycardia runs occurred, the run with the fastest interval lasting 12 beats with a max rate of 150 bpm,  the longest  ?lasting 18 beats with an avg rate of 107 bpm. 2 Supraventricular Tachycardia runs occurred, the run with the fastest interval lasting 16 beats with a max rate of 158 bpm (avg 143 bpm); the run with the fastest interval was also the longest. Some episodes ? of Supraventricular Tachycardia conducted with possible aberrancy. Isolated SVEs were rare (<1.0%), SVE Couplets were rare (<1.0%), and no SVE Triplets were present. Isolated VEs were rare (<1.0%, 299), VE Couplets were rare (<1.0%, 2), and VE  Triplets  ?were rare (<1.0%, 1). ? ? ?ASSESSMENT AND PLAN: ? ?1.  Demand ischemia. She had elevated troponins following a bladder biopsy and  in the setting of Afib with RVR and anemia.  LV function was good by Echo. On ASA, Statin and metoprolol. She has atypical chest pain most c/w reflux. I still recommend further evaluation to assess her ischemic risk. We will attempt once again to schedule for coronary CTA.  ?2. HTN controlled ?3. HLD. On Statin now. Will check lipid panel.  ?4. Paroxysmal Afib during bladder surgery. No recurrence. Event monitor without Afib. Will defer anticoagulation unless she has documented Afib.  ?5. Lower extremity edema. Normal Echo. Continue lasix 40 mg daily. Restrict sodium intake. Recommend compression hose.  ? ? ?Current medicines are reviewed at length with the patient today.  The patient does not have concerns regarding medicines. ? ?The following changes have been made:  no change ? ?Labs/ tests ordered today include:  ? ?Orders Placed This Encounter  ?Procedures  ? CT CORONARY MORPH W/CTA COR W/SCORE W/CA W/CM &/OR WO/CM  ? Basic metabolic panel  ? Lipid panel  ? ? ? ?Disposition:   FU with me  after above study ? ?Signed, ?Lucillia Corson Martinique, MD  ?07/14/2021 2:45 PM    ?[facility] ?[address] [place], [place], [address] ?Phone [phone], Fax [phone] ? ? ?

## 2021-07-14 ENCOUNTER — Ambulatory Visit (INDEPENDENT_AMBULATORY_CARE_PROVIDER_SITE_OTHER): Payer: Medicare HMO | Admitting: Cardiology

## 2021-07-14 ENCOUNTER — Encounter: Payer: Self-pay | Admitting: Cardiology

## 2021-07-14 VITALS — BP 130/50 | HR 73 | Ht 66.5 in | Wt 219.4 lb

## 2021-07-14 DIAGNOSIS — Z01812 Encounter for preprocedural laboratory examination: Secondary | ICD-10-CM | POA: Diagnosis not present

## 2021-07-14 DIAGNOSIS — R079 Chest pain, unspecified: Secondary | ICD-10-CM | POA: Diagnosis not present

## 2021-07-14 DIAGNOSIS — R072 Precordial pain: Secondary | ICD-10-CM

## 2021-07-14 DIAGNOSIS — I480 Paroxysmal atrial fibrillation: Secondary | ICD-10-CM

## 2021-07-14 MED ORDER — METOPROLOL TARTRATE 100 MG PO TABS: ORAL_TABLET | ORAL | 0 refills | Status: DC

## 2021-07-14 MED ORDER — FUROSEMIDE 20 MG PO TABS: 40.0000 mg | ORAL_TABLET | Freq: Every day | ORAL | Status: DC

## 2021-07-14 NOTE — Patient Instructions (Addendum)
Medication Instructions:  ?Continue same medications ?*If you need a refill on your cardiac medications before your next appointment, please call your pharmacy* ? ? ?Lab Work: ?Bmet and Lipid Panel today ? ? ?Testing/Procedures: ?Coronary CT will be scheduled after approved by insurance   Follow instructions below ? ? ?Follow-Up: ?At [facility], you and your health needs are our priority.  As part of our continuing mission to provide you with exceptional heart care, we have created designated Provider Care Teams.  These Care Teams include your primary Cardiologist (physician) and Advanced Practice Providers (APPs -  Physician Assistants and Nurse Practitioners) who all work together to provide you with the care you need, when you need it. ? ?We recommend signing up for the patient portal called "MyChart".  Sign up information is provided on this After Visit Summary.  MyChart is used to connect with patients for Virtual Visits (Telemedicine).  Patients are able to view lab/test results, encounter notes, upcoming appointments, etc.  Non-urgent messages can be sent to your provider as well.   ?To learn more about what you can do with MyChart, go to [email or web address].   ? ?Your next appointment:   ?  ? ?The format for your next appointment: Office  ? ? ?Provider:  Dr.Jordan ?{ ? ? ?Your cardiac CT will be scheduled at one of the below locations:  ? ?[facility] ?[address] ?[place], [place] [address] ?[phone] ? ?OR ? ?[place] ?[address] ?[address] ?[place], [place] [address] ?[phone] ? ?If scheduled at [facility], please arrive at the [facility] Entrance (Entrance C2) of [facility] 30 minutes prior to test start time. ?You can use the FREE valet parking offered at entrance C (encouraged to control the heart rate for the test)  ?Proceed to the [facility] Radiology Department (first floor) to check-in and test  prep. ? ?All radiology patients and guests should use entrance C2 at [facility], accessed from [facility], even though the hospital's physical address listed is [address] ? ? ? ?If scheduled at [facility], please arrive 15 mins early for check-in and test prep. ? ?Please follow these instructions carefully (unless otherwise directed): ? ? ? ?On the Night Before the Test: ?Be sure to Drink plenty of water. ?Do not consume any caffeinated/decaffeinated beverages or chocolate 12 hours prior to your test. ?Do not take any antihistamines 12 hours prior to your test. ?If the patient has contrast allergy: ? ? ?On the Day of the Test: ?Drink plenty of water until 1 hour prior to the test. ?Do not eat any food 4 hours prior to the test. ?You may take your regular medications prior to the test.  ?Take metoprolol 100 mg two hours prior to test. ?Hold metoprolol 25 mg morning of test. ?HOLD Furosemide/Triamterene/Hydrochlorothiazide morning of the test. ?FEMALES- please wear underwire-free bra if available, avoid dresses & tight clothing ? ?     ?After the Test: ?Drink plenty of water. ?After receiving IV contrast, you may experience a mild flushed feeling. This is normal. ?On occasion, you may experience a mild rash up to 24 hours after the test. This is not dangerous. If this occurs, you can take Benadryl 25 mg and increase your fluid intake. ?If you experience trouble breathing, this can be serious. If it is severe call 911 IMMEDIATELY. If it is mild, please call our office. ? ? ?We will call  to schedule your test 2-4 weeks out understanding that some insurance companies will need an authorization prior to the service being performed.  ? ?For non-scheduling related questions, please contact the cardiac imaging nurse navigator should you have any questions/concerns: ?Marchia Bond, Cardiac Imaging Nurse Navigator ?Gordy Clement, Cardiac Imaging Nurse  Navigator ?[facility] ?Direct Office Dial: [phone]  ? ?For scheduling needs, including cancellations and rescheduling, please call [place], [phone]. ? ?Important Information About Sugar ? ? ? ? ? ? ?

## 2021-07-15 ENCOUNTER — Other Ambulatory Visit: Payer: Self-pay

## 2021-07-15 LAB — BASIC METABOLIC PANEL
BUN/Creatinine Ratio: 19 (ref 12–28)
BUN: 15 mg/dL (ref 8–27)
CO2: 25 mmol/L (ref 20–29)
Calcium: 9 mg/dL (ref 8.7–10.3)
Chloride: 98 mmol/L (ref 96–106)
Creatinine, Ser: 0.81 mg/dL (ref 0.57–1.00)
Glucose: 130 mg/dL — ABNORMAL HIGH (ref 70–99)
Potassium: 4 mmol/L (ref 3.5–5.2)
Sodium: 136 mmol/L (ref 134–144)
eGFR: 76 mL/min/{1.73_m2} (ref >59)

## 2021-07-15 LAB — LIPID PANEL
Chol/HDL Ratio: 3 ratio (ref 0.0–4.4)
Cholesterol, Total: 134 mg/dL (ref 100–199)
HDL: 45 mg/dL (ref >39)
LDL Chol Calc (NIH): 57 mg/dL (ref 0–99)
Triglycerides: 194 mg/dL — ABNORMAL HIGH (ref 0–149)
VLDL Cholesterol Cal: 32 mg/dL (ref 5–40)

## 2021-07-24 ENCOUNTER — Telehealth (HOSPITAL_COMMUNITY): Payer: Self-pay | Admitting: Emergency Medicine

## 2021-07-24 NOTE — Telephone Encounter (Signed)
Reaching out to patient to offer assistance regarding upcoming cardiac imaging study; pt verbalizes understanding of appt date/time, parking situation and where to check in, pre-test NPO status and medications ordered, and verified current allergies; name and call back number provided for further questions should they arise ?Marchia Bond RN Navigator Cardiac Imaging ?[facility] ?[phone] office ?[phone] cell ? ?[place] ?Holding diuretics  ?'100mg'$  metoprolol tartrate ?Arrival 245 ?Difficult iv  ? ?

## 2021-07-25 ENCOUNTER — Telehealth (HOSPITAL_COMMUNITY): Payer: Self-pay | Admitting: Emergency Medicine

## 2021-07-25 ENCOUNTER — Ambulatory Visit (HOSPITAL_COMMUNITY)
Admission: RE | Admit: 2021-07-25 | Discharge: 2021-07-25 | Disposition: A | Discharge status: Home / Self Care | Payer: Medicare HMO | Source: Ambulatory Visit | Attending: Cardiology | Admitting: Cardiology

## 2021-07-25 DIAGNOSIS — Z01812 Encounter for preprocedural laboratory examination: Secondary | ICD-10-CM | POA: Insufficient documentation

## 2021-07-25 DIAGNOSIS — R079 Chest pain, unspecified: Secondary | ICD-10-CM | POA: Diagnosis present

## 2021-07-25 DIAGNOSIS — R072 Precordial pain: Secondary | ICD-10-CM | POA: Insufficient documentation

## 2021-07-25 IMAGING — CT CT HEART MORP W/ CTA COR W/ SCORE W/ CA W/CM &/OR W/O CM
4 of 7 series · 8 of 20 positions shown, 9 images · non-contrast
Comparison: None Available.
COMPARISON: None Available.
COMPARISON: None Available.

Addendum:
EXAM:
OVER-READ INTERPRETATION  CT CHEST

The following report is an over-read performed by radiologist Dr.
over-read does not include interpretation of cardiac or coronary
anatomy or pathology. The coronary CTA interpretation by the
cardiologist is attached.
CLINICAL DATA: Chest pain
Cardiac/Coronary  CTA
TECHNIQUE: The patient was scanned on a [employer] Force scanner.

[Series 6: ts syst sharp · axial · 0.39mm/px · z∈[-191,-158]mm · 2 of 246 slices shown]
[im 82/246  lung]
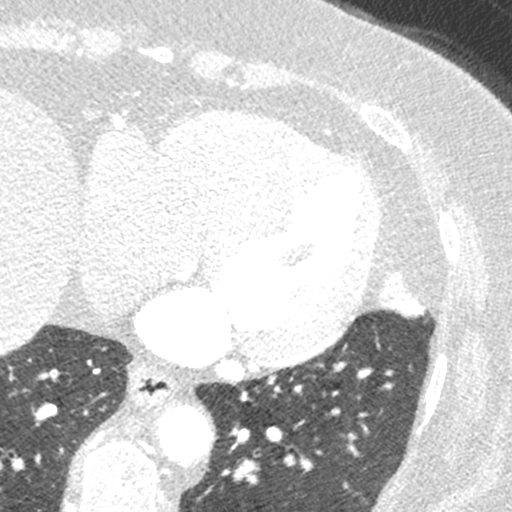
[im 164/246  lung]
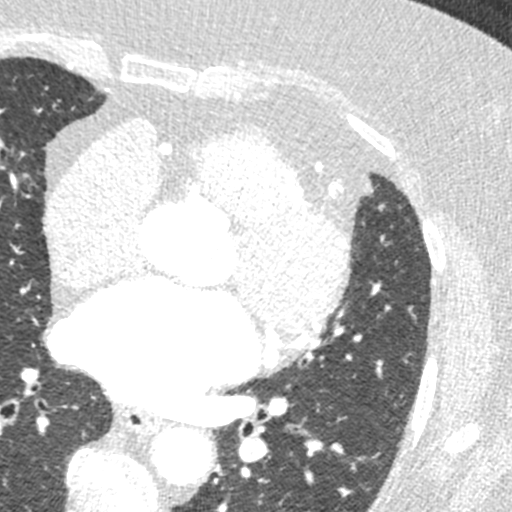

[Series 7: ts diast sharp · axial · 0.39mm/px · z∈[-191,-158]mm · 2 of 246 slices shown]
[im 82/246  lung]
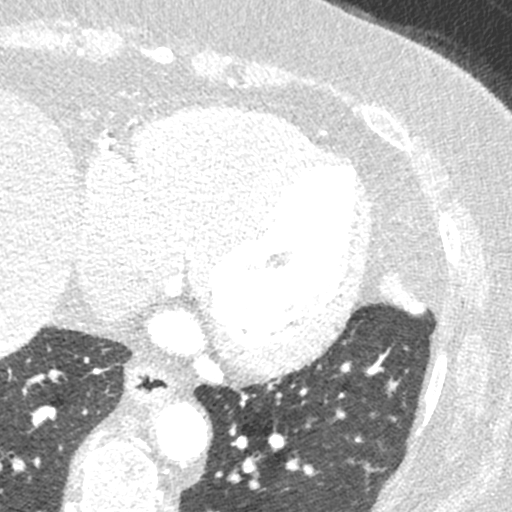
[im 164/246  lung]
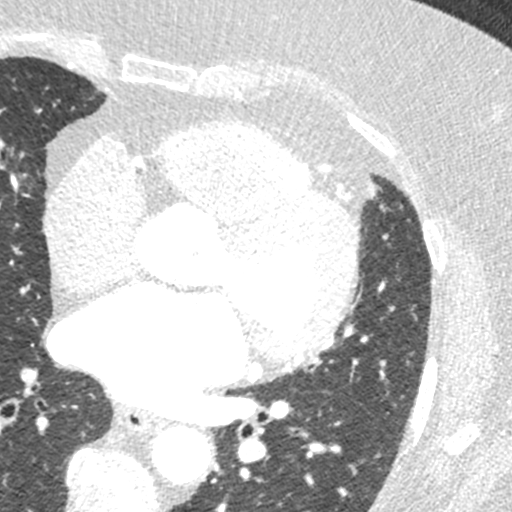

[Series 8: best diast · axial · 0.39mm/px · z∈[-191,-158]mm · 2 of 246 slices shown, 3 images]
[im 82/246  vessel]
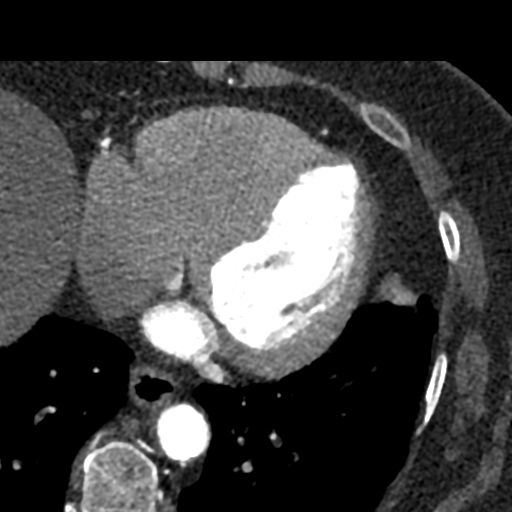
[im 82/246  lung]
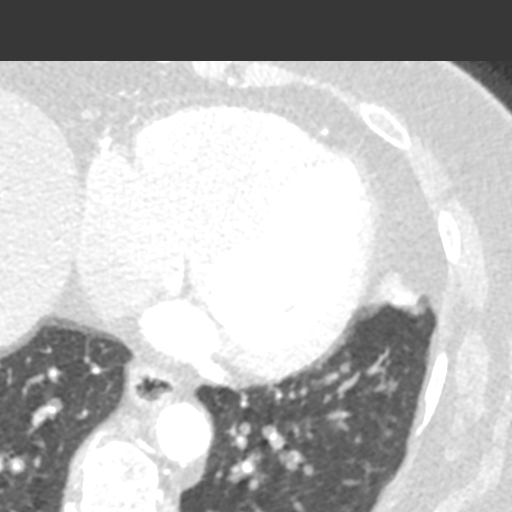
[im 164/246  vessel]
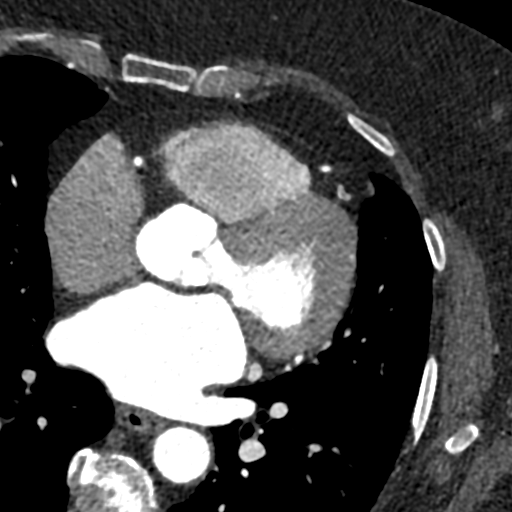

[Series 9: best syst · axial · 0.39mm/px · z∈[-191,-158]mm · 2 of 246 slices shown]
[im 82/246  vessel]
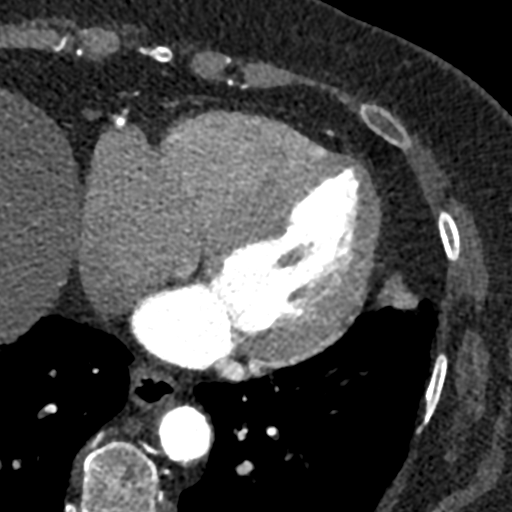
[im 164/246  vessel]
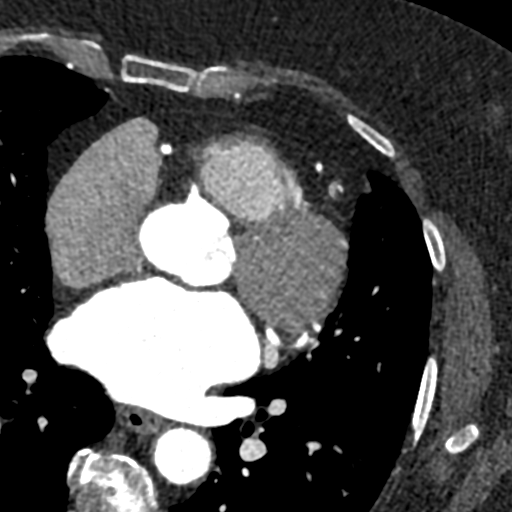

[8 of 20 positions shown; findings below may reference images not displayed]

FINDINGS: Vascular: No significant noncardiac vascular findings.

Mediastinum/Nodes: Visualized mediastinum and hilar regions
demonstrate no lymphadenopathy or masses.

Lungs/Pleura: Visualized lungs show no evidence of pulmonary edema,
consolidation, pneumothorax, nodule or pleural fluid.

Upper Abdomen: The visualized upper abdomen demonstrates evidence of
hepatic steatosis.

Musculoskeletal: No chest wall mass or suspicious bone lesions
identified.
IMPRESSION: Evidence of hepatic steatosis.
FINDINGS: A 100 kV prospective scan was triggered in the descending thoracic
aorta at 111 HU's. Axial non-contrast 3 mm slices were carried out
through the heart. The data set was analyzed on a dedicated work
station and scored using the Agatson method. Gantry rotation speed
was 250 msecs and collimation was .6 mm. 0.8 mg of sl NTG was given.
The 3D data set was reconstructed in 5% intervals of the 67-82 % of
the R-R cycle. Diastolic phases were analyzed on a dedicated work
station using MPR, MIP and VRT modes. The patient received 80 cc of
contrast.

Image quality: good

Aorta:  Normal size.  No calcifications.  No dissection.

Aortic Valve:  Trileaflet.  Mild annular calcifications.

Coronary Arteries:  Normal coronary origin.  Right dominance.

RCA is a large dominant artery that gives rise to PDA and PLA. There
is no plaque. Mild aortic annular calcification close to ostium of
RCA.

Left main is a large artery that gives rise to LAD and LCX arteries.

LAD is a large vessel that has mild stenosis at bifurcation of first
diagonal 0-24%.

LCX is a non-dominant artery that gives rise to one large OM1
branch. There is no plaque.

Other findings:

Normal pulmonary vein drainage into the left atrium.

Normal left atrial appendage without a thrombus.

Normal size of the pulmonary artery.

Please see radiology report for non cardiac findings.
IMPRESSION: 1. Coronary calcium score of 0.

2. Normal coronary origin with right dominance.

3. Mild LAD mid vessel stenosis at bifurcation of first diagonal
(0-24%).

ADDENDUM:
Ostial RCA appeared 40-50% upon review of road map [HOSPITAL]
modeling. Study was sent for FFR analysis. FFR was normal 0.97 post
lesion.

1.  Moderate non obstruction plaque of ostial RCA.  FFR negative.

2.  Recommend continued medical goal directed therapy.

*** End of Addendum ***
Addendum:
EXAM:
OVER-READ INTERPRETATION  CT CHEST

The following report is an over-read performed by radiologist Dr.
over-read does not include interpretation of cardiac or coronary
anatomy or pathology. The coronary CTA interpretation by the
cardiologist is attached.
FINDINGS: Vascular: No significant noncardiac vascular findings.

Mediastinum/Nodes: Visualized mediastinum and hilar regions
demonstrate no lymphadenopathy or masses.

Lungs/Pleura: Visualized lungs show no evidence of pulmonary edema,
consolidation, pneumothorax, nodule or pleural fluid.

Upper Abdomen: The visualized upper abdomen demonstrates evidence of
hepatic steatosis.

Musculoskeletal: No chest wall mass or suspicious bone lesions
identified.
IMPRESSION: Evidence of hepatic steatosis.
FINDINGS: A 100 kV prospective scan was triggered in the descending thoracic
aorta at 111 HU's. Axial non-contrast 3 mm slices were carried out
through the heart. The data set was analyzed on a dedicated work
station and scored using the Agatson method. Gantry rotation speed
was 250 msecs and collimation was .6 mm. 0.8 mg of sl NTG was given.
The 3D data set was reconstructed in 5% intervals of the 67-82 % of
the R-R cycle. Diastolic phases were analyzed on a dedicated work
station using MPR, MIP and VRT modes. The patient received 80 cc of
contrast.

Image quality: good

Aorta:  Normal size.  No calcifications.  No dissection.

Aortic Valve:  Trileaflet.  Mild annular calcifications.

Coronary Arteries:  Normal coronary origin.  Right dominance.

RCA is a large dominant artery that gives rise to PDA and PLA. There
is no plaque. Mild aortic annular calcification close to ostium of
RCA.

Left main is a large artery that gives rise to LAD and LCX arteries.

LAD is a large vessel that has mild stenosis at bifurcation of first
diagonal 0-24%.

LCX is a non-dominant artery that gives rise to one large OM1
branch. There is no plaque.

Other findings:

Normal pulmonary vein drainage into the left atrium.

Normal left atrial appendage without a thrombus.

Normal size of the pulmonary artery.

Please see radiology report for non cardiac findings.
IMPRESSION: 1. Coronary calcium score of 0.

2. Normal coronary origin with right dominance.

3. Mild LAD mid vessel stenosis at bifurcation of first diagonal
(0-24%).

*** End of Addendum ***
EXAM:
OVER-READ INTERPRETATION  CT CHEST

The following report is an over-read performed by radiologist Dr.
over-read does not include interpretation of cardiac or coronary
anatomy or pathology. The coronary CTA interpretation by the
cardiologist is attached.
FINDINGS: Vascular: No significant noncardiac vascular findings.

Mediastinum/Nodes: Visualized mediastinum and hilar regions
demonstrate no lymphadenopathy or masses.

Lungs/Pleura: Visualized lungs show no evidence of pulmonary edema,
consolidation, pneumothorax, nodule or pleural fluid.

Upper Abdomen: The visualized upper abdomen demonstrates evidence of
hepatic steatosis.

Musculoskeletal: No chest wall mass or suspicious bone lesions
identified.
IMPRESSION: Evidence of hepatic steatosis.

## 2021-07-25 MED ORDER — NITROGLYCERIN 0.4 MG SL SUBL
SUBLINGUAL_TABLET | SUBLINGUAL | Status: AC
  Filled 2021-07-25: qty 2

## 2021-07-25 MED ORDER — SODIUM CHLORIDE 0.9% FLUSH: 10.0000 mL | Freq: Two times a day (BID) | INTRAVENOUS | Status: DC

## 2021-07-25 MED ORDER — SODIUM CHLORIDE 0.9% FLUSH: 10.0000 mL | INTRAVENOUS | Status: DC | PRN

## 2021-07-25 MED ORDER — IOHEXOL 350 MG/ML SOLN
100.0000 mL | Freq: Once | INTRAVENOUS | Status: AC | PRN
  Administered 2021-07-25: 100 mL via INTRAVENOUS

## 2021-07-25 MED ORDER — NITROGLYCERIN 0.4 MG SL SUBL
0.8000 mg | SUBLINGUAL_TABLET | Freq: Once | SUBLINGUAL | Status: AC
  Administered 2021-07-25: 0.8 mg via SUBLINGUAL

## 2021-07-25 MED ORDER — METOPROLOL TARTRATE 5 MG/5ML IV SOLN: 5.0000 mg | INTRAVENOUS | Status: DC | PRN

## 2021-07-25 NOTE — Telephone Encounter (Signed)
Pt calling to clarify medication question ? ?Marchia Bond RN Navigator Cardiac Imaging ?[facility] ?[phone] Office  ?[phone] Cell ? ?

## 2021-07-28 ENCOUNTER — Ambulatory Visit (HOSPITAL_BASED_OUTPATIENT_CLINIC_OR_DEPARTMENT_OTHER)
Admission: RE | Admit: 2021-07-28 | Discharge: 2021-07-28 | Disposition: A | Discharge status: Home / Self Care | Payer: Medicare HMO | Source: Ambulatory Visit | Attending: Cardiology | Admitting: Cardiology

## 2021-07-28 ENCOUNTER — Other Ambulatory Visit: Payer: Self-pay | Admitting: Cardiology

## 2021-07-28 ENCOUNTER — Ambulatory Visit (HOSPITAL_COMMUNITY)
Admission: RE | Admit: 2021-07-28 | Discharge: 2021-07-28 | Disposition: A | Discharge status: Home / Self Care | Payer: Medicare HMO | Source: Ambulatory Visit | Attending: Cardiology | Admitting: Cardiology

## 2021-07-28 DIAGNOSIS — R931 Abnormal findings on diagnostic imaging of heart and coronary circulation: Secondary | ICD-10-CM | POA: Diagnosis present

## 2021-07-28 DIAGNOSIS — R072 Precordial pain: Secondary | ICD-10-CM

## 2021-07-29 DIAGNOSIS — R5383 Other fatigue: Secondary | ICD-10-CM | POA: Diagnosis not present

## 2021-07-29 DIAGNOSIS — K219 Gastro-esophageal reflux disease without esophagitis: Secondary | ICD-10-CM | POA: Diagnosis not present

## 2021-07-29 DIAGNOSIS — J45909 Unspecified asthma, uncomplicated: Secondary | ICD-10-CM | POA: Diagnosis not present

## 2021-07-29 DIAGNOSIS — I480 Paroxysmal atrial fibrillation: Secondary | ICD-10-CM | POA: Diagnosis not present

## 2021-07-29 DIAGNOSIS — F419 Anxiety disorder, unspecified: Secondary | ICD-10-CM | POA: Diagnosis not present

## 2021-08-05 ENCOUNTER — Telehealth: Payer: Self-pay

## 2021-08-05 ENCOUNTER — Telehealth: Payer: Self-pay | Admitting: Cardiology

## 2021-08-05 NOTE — Telephone Encounter (Signed)
Patient returning call in regards to CT results ?

## 2021-08-05 NOTE — Telephone Encounter (Signed)
Spoke to patient coronary ct results given. 

## 2021-08-05 NOTE — Telephone Encounter (Signed)
Received a call from patient coronary ct results given. ?

## 2021-08-05 NOTE — Telephone Encounter (Signed)
?  Transferred call to [place] ?

## 2021-08-08 ENCOUNTER — Other Ambulatory Visit: Payer: Self-pay

## 2021-08-08 ENCOUNTER — Other Ambulatory Visit: Payer: Self-pay | Admitting: Hematology and Oncology

## 2021-08-08 DIAGNOSIS — D72829 Elevated white blood cell count, unspecified: Secondary | ICD-10-CM

## 2021-08-11 ENCOUNTER — Other Ambulatory Visit: Payer: Self-pay | Admitting: Hematology and Oncology

## 2021-08-11 ENCOUNTER — Encounter: Payer: Self-pay | Admitting: Hematology and Oncology

## 2021-08-11 ENCOUNTER — Inpatient Hospital Stay: Payer: Medicare HMO | Attending: Hematology and Oncology | Admitting: Hematology and Oncology

## 2021-08-11 ENCOUNTER — Inpatient Hospital Stay: Payer: Medicare HMO

## 2021-08-11 DIAGNOSIS — E559 Vitamin D deficiency, unspecified: Secondary | ICD-10-CM | POA: Diagnosis not present

## 2021-08-11 DIAGNOSIS — E119 Type 2 diabetes mellitus without complications: Secondary | ICD-10-CM

## 2021-08-11 DIAGNOSIS — I480 Paroxysmal atrial fibrillation: Secondary | ICD-10-CM | POA: Diagnosis not present

## 2021-08-11 DIAGNOSIS — E785 Hyperlipidemia, unspecified: Secondary | ICD-10-CM | POA: Diagnosis not present

## 2021-08-11 DIAGNOSIS — Z7982 Long term (current) use of aspirin: Secondary | ICD-10-CM | POA: Diagnosis not present

## 2021-08-11 DIAGNOSIS — M8580 Other specified disorders of bone density and structure, unspecified site: Secondary | ICD-10-CM

## 2021-08-11 DIAGNOSIS — Z90710 Acquired absence of both cervix and uterus: Secondary | ICD-10-CM

## 2021-08-11 DIAGNOSIS — D649 Anemia, unspecified: Secondary | ICD-10-CM

## 2021-08-11 DIAGNOSIS — D509 Iron deficiency anemia, unspecified: Secondary | ICD-10-CM | POA: Insufficient documentation

## 2021-08-11 DIAGNOSIS — J45909 Unspecified asthma, uncomplicated: Secondary | ICD-10-CM | POA: Diagnosis not present

## 2021-08-11 DIAGNOSIS — Z79899 Other long term (current) drug therapy: Secondary | ICD-10-CM | POA: Insufficient documentation

## 2021-08-11 DIAGNOSIS — R5383 Other fatigue: Secondary | ICD-10-CM | POA: Insufficient documentation

## 2021-08-11 DIAGNOSIS — I10 Essential (primary) hypertension: Secondary | ICD-10-CM | POA: Diagnosis not present

## 2021-08-11 DIAGNOSIS — D72829 Elevated white blood cell count, unspecified: Secondary | ICD-10-CM

## 2021-08-11 DIAGNOSIS — Z809 Family history of malignant neoplasm, unspecified: Secondary | ICD-10-CM | POA: Diagnosis not present

## 2021-08-11 LAB — CBC AND DIFFERENTIAL
HCT: 35 — AB (ref 36–46)
Hemoglobin: 11.2 — AB (ref 12.0–16.0)
Neutrophils Absolute: 7.41
Platelets: 325 10*3/uL (ref 150–400)
WBC: 13

## 2021-08-11 LAB — IRON AND TIBC
Iron: 46 ug/dL (ref 28–170)
Saturation Ratios: 9 % — ABNORMAL LOW (ref 10.4–31.8)
TIBC: 500 ug/dL — ABNORMAL HIGH (ref 250–450)
UIBC: 454 ug/dL

## 2021-08-11 LAB — FOLATE: Folate: 18.5 ng/mL (ref >5.9)

## 2021-08-11 LAB — FERRITIN: Ferritin: 12 ng/mL (ref 11–307)

## 2021-08-11 LAB — CBC: RBC: 4.14 (ref 3.87–5.11)

## 2021-08-11 LAB — VITAMIN B12: Vitamin B-12: 199 pg/mL (ref 180–914)

## 2021-08-11 NOTE — Progress Notes (Incomplete)
[name]  [address] [place],  [place]  [address] [phone]  Clinic Day:  08/12/2021  Referring physician: Leonides Sake, MD   REASON FOR CONSULTATION:  Leukocytosis  HISTORY OF PRESENT ILLNESS:  Taylor Arroyo is a 76 y.o. female with a history of elevated white count with decreased hemoglobin who is referred in consultation by Daiva Eves, MD for assessment and management. She was noted to have persistent elevated white count including lymphocytes, monocytes and eosinophils. She has also had decreasing platelet count and hemoglobin with borderline anemia. She appears to be asymptomatic with these noted results other than fatigue. She notes that she has been dealing with fatigue for several years. She had a "bladder biopsy" in the past with negative results. She had severe hematuria at that time, but denies any recent bleeding.  She does note several teeth that are in need of pulling, but she has been unable to have that done.   Today, she denies fever, chills, nausea or vomiting. She denies shortness of breath, chest pain or cough. She denies issue with bowel or bladder. Her appetite is good and her weight is stable. Medical history is significant for anxiety/ depression, fatigue, GERD, paroxysmal a-fib, hypertension, hyperlipidemia, osteopenia, Vitamin D deficiency and asthma. Family history is significant for diabetes and hypertension. Surgical history consists of hysterectomy and cholecystectomy. She is a retired Engineer, manufacturing systems. She is married and lives with her husband. She has 4 children. She is a never smoker with occasional alcohol use.    REVIEW OF SYSTEMS:  Review of Systems  Constitutional:  Positive for fatigue. Negative for appetite change, chills, diaphoresis, fever and unexpected weight change.  HENT:   Negative for hearing loss, lump/mass, mouth sores, nosebleeds, sore throat, tinnitus, trouble swallowing and voice change.    Eyes:  Negative for eye problems and icterus.  Respiratory:  Negative for chest tightness, cough, hemoptysis, shortness of breath and wheezing.   Cardiovascular:  Negative for chest pain, leg swelling and palpitations.  Gastrointestinal:  Negative for abdominal distention, abdominal pain, blood in stool, constipation, diarrhea, nausea, rectal pain and vomiting.  Endocrine: Negative for hot flashes.  Genitourinary:  Negative for bladder incontinence, difficulty urinating, dyspareunia, dysuria, frequency, hematuria and nocturia.   Musculoskeletal:  Negative for arthralgias, back pain, flank pain, gait problem, myalgias, neck pain and neck stiffness.  Skin:  Negative for itching, rash and wound.  Neurological:  Negative for dizziness, extremity weakness, gait problem, headaches, light-headedness, numbness, seizures and speech difficulty.  Hematological:  Negative for adenopathy. Does not bruise/bleed easily.  Psychiatric/Behavioral:  Negative for confusion, decreased concentration, depression, sleep disturbance and suicidal ideas. The patient is not nervous/anxious.     VITALS:  Blood pressure (!) 157/70, pulse 84, temperature 98.5 F (36.9 C), temperature source Oral, resp. rate 18, height 5' 5.4" (1.661 m), weight 213 lb 9.6 oz (96.9 kg), SpO2 96 %.  Wt Readings from Last 3 Encounters:  08/11/21 213 lb 9.6 oz (96.9 kg)  07/14/21 219 lb 6.4 oz (99.5 kg)  04/07/21 209 lb 12.8 oz (95.2 kg)    Body mass index is 35.11 kg/m.  Performance status (ECOG): 1 - Symptomatic but completely ambulatory  PHYSICAL EXAM:  Physical Exam Constitutional:      General: She is not in acute distress.    Appearance: Normal appearance. She is normal weight. She is not ill-appearing, toxic-appearing or diaphoretic.  HENT:     Head: Normocephalic and atraumatic.     Right  Ear: Tympanic membrane normal.     Left Ear: Tympanic membrane normal.     Nose: Nose normal. No congestion or rhinorrhea.      Mouth/Throat:     Mouth: Mucous membranes are moist.     Pharynx: Oropharynx is clear. No oropharyngeal exudate or posterior oropharyngeal erythema.  Eyes:     General: No scleral icterus.       Right eye: No discharge.        Left eye: No discharge.     Extraocular Movements: Extraocular movements intact.     Conjunctiva/sclera: Conjunctivae normal.     Pupils: Pupils are equal, round, and reactive to light.  Neck:     Vascular: No carotid bruit.  Cardiovascular:     Rate and Rhythm: Normal rate and regular rhythm.     Heart sounds: No murmur heard.   No friction rub. No gallop.  Pulmonary:     Effort: Pulmonary effort is normal. No respiratory distress.     Breath sounds: Normal breath sounds. No stridor. No wheezing, rhonchi or rales.  Chest:     Chest wall: No tenderness.  Abdominal:     General: Abdomen is flat. Bowel sounds are normal. There is no distension.     Palpations: There is no mass.     Tenderness: There is no abdominal tenderness. There is no right CVA tenderness, left CVA tenderness, guarding or rebound.     Hernia: No hernia is present.  Musculoskeletal:        General: No swelling, tenderness, deformity or signs of injury. Normal range of motion.     Cervical back: Normal range of motion and neck supple. No rigidity or tenderness.     Right lower leg: No edema.     Left lower leg: No edema.  Lymphadenopathy:     Cervical: No cervical adenopathy.  Skin:    General: Skin is warm and dry.     Capillary Refill: Capillary refill takes less than 2 seconds.     Coloration: Skin is not jaundiced or pale.     Findings: No bruising, erythema, lesion or rash.  Neurological:     General: No focal deficit present.     Mental Status: She is alert and oriented to person, place, and time. Mental status is at baseline.     Cranial Nerves: No cranial nerve deficit.     Sensory: No sensory deficit.     Motor: No weakness.     Coordination: Coordination normal.     Gait:  Gait normal.     Deep Tendon Reflexes: Reflexes normal.  Psychiatric:        Mood and Affect: Mood normal.        Behavior: Behavior normal.        Thought Content: Thought content normal.        Judgment: Judgment normal.     LABS:      Latest Ref Rng & Units 08/11/2021   12:00 AM  CBC  WBC  13.0       Hemoglobin 12.0 - 16.0 11.2       Hematocrit 36 - 46 35       Platelets 150 - 400 K/uL 325          This result is from an external source.      Latest Ref Rng & Units 07/14/2021    3:02 PM 04/07/2021    2:03 PM 02/25/2021    2:15 PM  CMP  Glucose  70 - 99 mg/dL 130   76   101    BUN 8 - 27 mg/dL '15   22   18    '$ Creatinine 0.57 - 1.00 mg/dL 0.81   1.00   0.98    Sodium 134 - 144 mmol/L 136   138   138    Potassium 3.5 - 5.2 mmol/L 4.0   4.4   4.3    Chloride 96 - 106 mmol/L 98   97   100    CO2 20 - 29 mmol/L '25   22   24    '$ Calcium 8.7 - 10.3 mg/dL 9.0   9.4   9.4       No results found for: CEA1 / No results found for: CEA1 No results found for: PSA1 No results found for: WCB762 No results found for: GBT517  No results found for: TOTALPROTELP, ALBUMINELP, A1GS, A2GS, BETS, BETA2SER, GAMS, MSPIKE, SPEI Lab Results  Component Value Date   TIBC 500 (H) 08/11/2021   FERRITIN 12 08/11/2021   IRONPCTSAT 9 (L) 08/11/2021   No results found for: LDH  STUDIES:  CT CORONARY MORPH W/CTA COR W/SCORE W/CA W/CM &/OR WO/CM  Addendum Date: 07/28/2021   ADDENDUM REPORT: 07/28/2021 07:04 ADDENDUM: Ostial RCA appeared 40-50% upon review of road map HeartFlow modeling. Study was sent for [facility] analysis. FFR was normal 0.97 post lesion. 1.  Moderate non obstruction plaque of ostial RCA.  FFR negative. 2.  Recommend continued medical goal directed therapy. Electronically Signed   By: Candee Furbish M.D.   On: 07/28/2021 07:04   Addendum Date: 07/25/2021   ADDENDUM REPORT: 07/25/2021 16:58 CLINICAL DATA:  Chest pain EXAM: Cardiac/Coronary  CTA TECHNIQUE: The patient was scanned on a [employer]. FINDINGS: A 100 kV prospective scan was triggered in the descending thoracic aorta at 111 HU's. Axial non-contrast 3 mm slices were carried out through the heart. The data set was analyzed on a dedicated work station and scored using the [place]. Gantry rotation speed was 250 msecs and collimation was .6 mm. 0.8 mg of sl NTG was given. The 3D data set was reconstructed in 5% intervals of the 67-82 % of the R-R cycle. Diastolic phases were analyzed on a dedicated work station using MPR, MIP and VRT modes. The patient received 80 cc of contrast. Image quality: good Aorta:  Normal size.  No calcifications.  No dissection. Aortic Valve:  Trileaflet.  Mild annular calcifications. Coronary Arteries:  Normal coronary origin.  Right dominance. RCA is a large dominant artery that gives rise to PDA and PLA. There is no plaque. Mild aortic annular calcification close to ostium of RCA. Left main is a large artery that gives rise to LAD and LCX arteries. LAD is a large vessel that has mild stenosis at bifurcation of first diagonal 0-24%. LCX is a non-dominant artery that gives rise to one large OM1 branch. There is no plaque. Other findings: Normal pulmonary vein drainage into the left atrium. Normal left atrial appendage without a thrombus. Normal size of the pulmonary artery. Please see radiology report for non cardiac findings. IMPRESSION: 1. Coronary calcium score of 0. 2. Normal coronary origin with right dominance. 3. Mild LAD mid vessel stenosis at bifurcation of first diagonal (0-24%). Electronically Signed   By: Candee Furbish M.D.   On: 07/25/2021 16:58   Result Date: 07/28/2021 EXAM: OVER-READ INTERPRETATION  CT CHEST The following report is an over-read performed by radiologist Dr. Aletta Edouard  of [facility], [place] on 07/25/2021. This over-read does not include interpretation of cardiac or coronary anatomy or pathology. The coronary CTA interpretation by the cardiologist is attached.  COMPARISON:  None Available. FINDINGS: Vascular: No significant noncardiac vascular findings. Mediastinum/Nodes: Visualized mediastinum and hilar regions demonstrate no lymphadenopathy or masses. Lungs/Pleura: Visualized lungs show no evidence of pulmonary edema, consolidation, pneumothorax, nodule or pleural fluid. Upper Abdomen: The visualized upper abdomen demonstrates evidence of hepatic steatosis. Musculoskeletal: No chest wall mass or suspicious bone lesions identified. IMPRESSION: Evidence of hepatic steatosis. Electronically Signed: By: Aletta Edouard M.D. On: 07/25/2021 16:34   CT CORONARY FRACTIONAL FLOW RESERVE FLUID ANALYSIS  Result Date: 07/28/2021 EXAM: FFRCT ANALYSIS FINDINGS: FFRct analysis was performed on the original cardiac CT angiogram dataset. Diagrammatic representation of the FFRct analysis is provided in a separate PDF document in PACS. This dictation was created using the PDF document and an interactive 3D model of the results. 3D model is not available in the EMR/PACS. Normal FFR range is >0.80. 1. Left Main: Normal 2. LAD: Normal 3. LCX: Normal 4. Ramus: N/a 5. RCA: Normal 0.97 post ostial moderate stenosis. IMPRESSION: Normal FFR . Note: These examples are not recommendations of HeartFlow and only provided as examples of what other customers are doing. Electronically Signed   By: Candee Furbish M.D.   On: 07/28/2021 07:05      HISTORY:   Past Medical History:  Diagnosis Date   Asthma    Atrial fibrillation (HCC)    Fatigue    GERD (gastroesophageal reflux disease)    Hyperlipidemia    Hypersomnia    Hypertension    Osteopenia    Urinary incontinence    Vitamin D deficiency     Past Surgical History:  Procedure Laterality Date   ABDOMINAL HYSTERECTOMY  1992   still has ovaries   CHOLECYSTECTOMY  2007    Family History  Problem Relation Age of Onset   Thyroid disease Mother    Heart attack Mother    Hypertension Mother    Diabetes Mother    Heart disease  Mother    Stroke Mother    Cancer Mother    Hyperlipidemia Father    Hypertension Father    Cirrhosis Father    Heart attack Father    Diabetes Sister    Heart disease Sister    Cancer Sister    Diabetes Sister     Social History:  reports that she has never smoked. She has never used smokeless tobacco. She reports current alcohol use of about 1.0 standard drink per week. She reports that she does not use drugs.The patient is alone wti today.  Allergies:  Allergies  Allergen Reactions   Penicillins Shortness Of Breath    Current Medications: Current Outpatient Medications  Medication Sig Dispense Refill   budesonide-formoterol (SYMBICORT) 160-4.5 MCG/ACT inhaler Inhale 2 puffs into the lungs 2 (two) times daily.     Cholecalciferol (VITAMIN D3) 10 MCG (400 UNIT) CAPS Take 1 tablet by mouth daily.     nitroGLYCERIN (NITROSTAT) 0.3 MG SL tablet Place 0.3 mg under the tongue every 5 (five) minutes as needed for chest pain.     omega-3 acid ethyl esters (LOVAZA) 1 g capsule Take 1 capsule by mouth 2 (two) times daily.     acetaminophen (TYLENOL) 500 MG tablet Take 500 mg by mouth every 6 (six) hours as needed for pain.     ALBUTEROL SULFATE HFA IN Inhale into the lungs.  aspirin EC 81 MG tablet Take 1 tablet (81 mg total) by mouth daily. Swallow whole. 90 tablet 3   Calcium Ascorbate 500 MG TABS Take by mouth.     cyclobenzaprine (FLEXERIL) 10 MG tablet TAKE 1 TABLET THREE TIMES A DAY AS NEEDED FOR MUSCLE SPASMS 30 tablet 2   diazepam (VALIUM) 5 MG tablet Take 5 mg by mouth every 12 (twelve) hours as needed for anxiety.      enalapril (VASOTEC) 20 MG tablet Take 20 mg by mouth daily.     furosemide (LASIX) 40 MG tablet Take 40 mg by mouth.     ipratropium (ATROVENT) 0.06 % nasal spray Place into both nostrils.     metoprolol tartrate (LOPRESSOR) 25 MG tablet Take 1 tablet (25 mg total) by mouth 2 (two) times daily. 180 tablet 3   montelukast (SINGULAIR) 10 MG tablet Take 10 mg by  mouth daily.     Omega-3 Fatty Acids (FISH OIL) 1000 MG CAPS Take 1,000 mg by mouth daily.     omeprazole (PRILOSEC) 40 MG capsule Take 40 mg by mouth daily.     rosuvastatin (CRESTOR) 20 MG tablet Take 1 tablet (20 mg total) by mouth daily. 90 tablet 3   triamterene-hydrochlorothiazide (DYAZIDE) 37.5-25 MG per capsule Take 1 capsule by mouth every morning.     venlafaxine XR (EFFEXOR-XR) 75 MG 24 hr capsule Take 75 mg by mouth daily.     No current facility-administered medications for this visit.     ASSESSMENT & PLAN:   Assessment:  Airyonna Franklyn is a 76 y.o. female with history of elevated white count and anemia. CBC today reveals elevated white count at 13 and hemoglobin 11.2 with iron saturation 9. We discussed watching white count as she has multiple teeth that are in need of attention. We also discussed iron infusions and she agreeable as she has been on oral iron in the past, but admits to not being compliant.   Plan: 1.  We will plan for IV Venofer and will see her back in 6 weeks.   I discussed the assessment and treatment plan with the patient.  The patient was provided an opportunity to ask questions and all were answered.  The patient agreed with the plan and demonstrated an understanding of the instructions.  The patient was advised to call back if the symptoms worsen or if the condition fails to improve as anticipated.  Thank you for the opportunity      Melodye Ped, NP

## 2021-08-12 ENCOUNTER — Encounter: Payer: Self-pay | Admitting: Hematology and Oncology

## 2021-08-12 ENCOUNTER — Telehealth: Payer: Self-pay | Admitting: Hematology and Oncology

## 2021-08-12 NOTE — Telephone Encounter (Signed)
08/12/21 lvm to scheduling appt

## 2021-08-13 ENCOUNTER — Other Ambulatory Visit: Payer: Self-pay | Admitting: Pharmacist

## 2021-08-14 ENCOUNTER — Encounter: Payer: Self-pay | Admitting: Hematology and Oncology

## 2021-08-14 ENCOUNTER — Ambulatory Visit: Payer: Medicare HMO

## 2021-08-14 MED FILL — Iron Sucrose Inj 20 MG/ML (Fe Equiv): INTRAVENOUS | Qty: 10 | Status: AC

## 2021-08-15 ENCOUNTER — Inpatient Hospital Stay: Payer: Medicare HMO

## 2021-08-15 ENCOUNTER — Encounter: Payer: Self-pay | Admitting: Hematology and Oncology

## 2021-08-15 ENCOUNTER — Other Ambulatory Visit: Payer: Self-pay | Admitting: Hematology and Oncology

## 2021-08-15 VITALS — BP 135/74 | HR 81 | Temp 98.3°F | Resp 18 | Ht 65.4 in | Wt 211.0 lb

## 2021-08-15 DIAGNOSIS — D72829 Elevated white blood cell count, unspecified: Secondary | ICD-10-CM | POA: Diagnosis not present

## 2021-08-15 DIAGNOSIS — D509 Iron deficiency anemia, unspecified: Secondary | ICD-10-CM

## 2021-08-15 MED ORDER — SODIUM CHLORIDE 0.9 % IV SOLN: Freq: Once | INTRAVENOUS | Status: AC

## 2021-08-15 MED ORDER — CYANOCOBALAMIN 1000 MCG/ML IJ SOLN: 1000.0000 ug | INTRAMUSCULAR | 0 refills | Status: DC

## 2021-08-15 MED ORDER — CYANOCOBALAMIN 1000 MCG/ML IJ SOLN
1000.0000 ug | Freq: Once | INTRAMUSCULAR | Status: AC
  Administered 2021-08-15: 1000 ug via INTRAMUSCULAR
  Filled 2021-08-15: qty 1

## 2021-08-15 MED ORDER — SODIUM CHLORIDE 0.9 % IV SOLN
200.0000 mg | Freq: Once | INTRAVENOUS | Status: AC
  Administered 2021-08-15: 200 mg via INTRAVENOUS
  Filled 2021-08-15: qty 200

## 2021-08-15 NOTE — Patient Instructions (Signed)

## 2021-08-17 NOTE — Progress Notes (Unsigned)
Cardiology Office Note   Date:  08/22/2021   ID:  Taylor Arroyo, Taylor Arroyo 1945/08/19, MRN [id number]  PCP:  Jeannene Patella, PA  Cardiologist:   Blane Worthington Martin[place], MD   Chief Complaint  Patient presents with   Atrial Fibrillation      History of Present Illness: Taylor Arroyo is a 76 y.o. female who is seen for follow up.   She has a history of HTN. She was admitted for a bladder biopsy to [facility] in October.  She went into rapid Afib. Apparently had some chest burning. Cardiac enzymes were elevated. Troponins were 0.15>0.21>0.31. She denied any chest pain or dyspnea. States she never had cardiac problems before. Echo was noted below. Was DC on ASA, metoprolol and Crestor. She thinks something happened while she was under anesthesia. There was some mention of doing a stress test but she deferred. When we saw her last a coronary CTA was recommended for ischemic evaluation. This was scheduled but then she cancelled x 2. She states her insurance wouldn't cover it. She has since changed insurance. On her last visit her insurance had changed. We revisited doing a coronary CTA but this was never done even though apparently approved by her insurance.  Event monitor in March showed no recurrent Afib.   We were able to finally get CT done which showed minimal nonobstructive disease in the mid LAD.   She does note symptoms of heart burn at night. Takes prilosec but it isn't helping. Complains of swelling in her legs. Takes lasix 40 mg daily. She is getting iron therapy for anemia.     Past Medical History:  Diagnosis Date   Asthma    Atrial fibrillation (HCC)    Fatigue    GERD (gastroesophageal reflux disease)    Hyperlipidemia    Hypersomnia    Hypertension    Osteopenia    Urinary incontinence    Vitamin D deficiency     Past Surgical History:  Procedure Laterality Date   ABDOMINAL HYSTERECTOMY  1992   still has ovaries   CHOLECYSTECTOMY  2007     Current  Outpatient Medications  Medication Sig Dispense Refill   acetaminophen (TYLENOL) 500 MG tablet Take 500 mg by mouth every 6 (six) hours as needed for pain.     ALBUTEROL SULFATE HFA IN Inhale into the lungs.     aspirin EC 81 MG tablet Take 1 tablet (81 mg total) by mouth daily. Swallow whole. 90 tablet 3   budesonide-formoterol (SYMBICORT) 160-4.5 MCG/ACT inhaler Inhale 2 puffs into the lungs 2 (two) times daily.     Calcium Ascorbate 500 MG TABS Take by mouth.     Cholecalciferol (VITAMIN D3) 10 MCG (400 UNIT) CAPS Take 1 tablet by mouth daily.     cyanocobalamin (,VITAMIN B-12,) 1000 MCG/ML injection Inject 1 mL (1,000 mcg total) into the muscle once a week. 4 mL 0   cyclobenzaprine (FLEXERIL) 10 MG tablet TAKE 1 TABLET THREE TIMES A DAY AS NEEDED FOR MUSCLE SPASMS 30 tablet 2   diazepam (VALIUM) 5 MG tablet Take 5 mg by mouth every 12 (twelve) hours as needed for anxiety.      enalapril (VASOTEC) 20 MG tablet Take 20 mg by mouth daily.     furosemide (LASIX) 40 MG tablet Take 40 mg by mouth.     ipratropium (ATROVENT) 0.06 % nasal spray Place into both nostrils.     metoprolol tartrate (LOPRESSOR) 25 MG tablet Take 1 tablet (  25 mg total) by mouth 2 (two) times daily. 180 tablet 3   montelukast (SINGULAIR) 10 MG tablet Take 10 mg by mouth daily.     nitroGLYCERIN (NITROSTAT) 0.3 MG SL tablet Place 0.3 mg under the tongue every 5 (five) minutes as needed for chest pain.     omega-3 acid ethyl esters (LOVAZA) 1 g capsule Take 1 capsule by mouth 2 (two) times daily.     Omega-3 Fatty Acids (FISH OIL) 1000 MG CAPS Take 1,000 mg by mouth daily.     omeprazole (PRILOSEC) 40 MG capsule Take 40 mg by mouth daily.     rosuvastatin (CRESTOR) 20 MG tablet Take 1 tablet (20 mg total) by mouth daily. 90 tablet 3   triamterene-hydrochlorothiazide (DYAZIDE) 37.5-25 MG per capsule Take 1 capsule by mouth every morning.     venlafaxine XR (EFFEXOR-XR) 75 MG 24 hr capsule Take 75 mg by mouth daily.     No  current facility-administered medications for this visit.    Allergies:   Penicillins    Social History:  The patient  reports that she has never smoked. She has never used smokeless tobacco. She reports current alcohol use of about 1.0 standard drink per week. She reports that she does not use drugs.   Family History:  The patient's family history includes Cancer in her mother and sister; Cirrhosis in her father; Diabetes in her mother, sister, and sister; Heart attack in her father and mother; Heart disease in her mother and sister; Hyperlipidemia in her father; Hypertension in her father and mother; Stroke in her mother; Thyroid disease in her mother.    ROS:  Please see the history of present illness.   Otherwise, review of systems are positive for none.   All other systems are reviewed and negative.    PHYSICAL EXAM: VS:  BP 130/74   Pulse 74   Ht 5' 6.5" (1.689 m)   Wt 216 lb (98 kg)   SpO2 96%   BMI 34.34 kg/m  , BMI Body mass index is 34.34 kg/m. GEN: Well nourished, well developed, in no acute distress HEENT: normal Neck: no JVD, carotid bruits, or masses Cardiac: RRR; no murmurs, rubs, or gallops,no edema  Respiratory:  clear to auscultation bilaterally, normal work of breathing GI: soft, nontender, nondistended, + BS MS: no deformity or atrophy Skin: warm and dry, no rash Neuro:  Strength and sensation are intact Psych: euthymic mood, full affect   EKG:  EKG is not ordered today.     Recent Labs: 07/14/2021: BUN 15; Creatinine, Ser 0.81; Potassium 4.0; Sodium 136 08/11/2021: Hemoglobin 11.2; Platelets 325   Dated 01/04/20: cholesterol 251, triglycerides 200, HDL 49, LDL 165.  Dated 07/04/20: creatinine 1.03. K+ 4.3. ALT 15.  Dated 10/ 12/22: Hgb 10.2 Dated 05/27/21: Hgb 11.1. otherwise CMET, CBC and TSH normal.   Lipid Panel    Component Value Date/Time   CHOL 134 07/14/2021 1502   TRIG 194 (H) 07/14/2021 1502   HDL 45 07/14/2021 1502   CHOLHDL 3.0  07/14/2021 1502   LDLCALC 57 07/14/2021 1502      Wt Readings from Last 3 Encounters:  08/22/21 216 lb (98 kg)  08/21/21 210 lb (95.3 kg)  08/15/21 211 lb (95.7 kg)      Other studies Reviewed: Additional studies/ records that were reviewed today include:   Echo 10/6//22: Normal EF 55-60% mild LVH. Impaired diastolic function. Mild LAE. Tr- mild AI.  Study Highlights    Normal sinus rhythm  2 episodes of SVT longest lasting 16 beats. 2 episodes of NSVT longest lasting 12 beats No Afib noted.     Patch Wear Time:  14 days and 0 hours (2023-01-28T13:21:12-498 to 2023-02-11T13:21:14-0500)   Patient had a min HR of 56 bpm, max HR of 158 bpm, and avg HR of 77 bpm. Predominant underlying rhythm was Sinus Rhythm. 2 Ventricular Tachycardia runs occurred, the run with the fastest interval lasting 12 beats with a max rate of 150 bpm, the longest  lasting 18 beats with an avg rate of 107 bpm. 2 Supraventricular Tachycardia runs occurred, the run with the fastest interval lasting 16 beats with a max rate of 158 bpm (avg 143 bpm); the run with the fastest interval was also the longest. Some episodes  of Supraventricular Tachycardia conducted with possible aberrancy. Isolated SVEs were rare (<1.0%), SVE Couplets were rare (<1.0%), and no SVE Triplets were present. Isolated VEs were rare (<1.0%, 299), VE Couplets were rare (<1.0%, 2), and VE Triplets  were rare (<1.0%, 1).  ADDENDUM REPORT: 07/28/2021 07:04   ADDENDUM: Ostial RCA appeared 40-50% upon review of road map HeartFlow modeling. Study was sent for [facility] analysis. FFR was normal 0.97 post lesion.   1.  Moderate non obstruction plaque of ostial RCA.  FFR negative.   2.  Recommend continued medical goal directed therapy.     Electronically Signed   By: Candee Furbish M.D.   On: 07/28/2021 07:04    Addended by Jerline Pain, MD on 07/28/2021  7:06 AM  ADDENDUM REPORT: 07/25/2021 16:58   CLINICAL DATA:  Chest pain    EXAM: Cardiac/Coronary  CTA   TECHNIQUE: The patient was scanned on a [employer].   FINDINGS: A 100 kV prospective scan was triggered in the descending thoracic aorta at 111 HU's. Axial non-contrast 3 mm slices were carried out through the heart. The data set was analyzed on a dedicated work station and scored using the [place]. Gantry rotation speed was 250 msecs and collimation was .6 mm. 0.8 mg of sl NTG was given. The 3D data set was reconstructed in 5% intervals of the 67-82 % of the R-R cycle. Diastolic phases were analyzed on a dedicated work station using MPR, MIP and VRT modes. The patient received 80 cc of contrast.   Image quality: good   Aorta:  Normal size.  No calcifications.  No dissection.   Aortic Valve:  Trileaflet.  Mild annular calcifications.   Coronary Arteries:  Normal coronary origin.  Right dominance.   RCA is a large dominant artery that gives rise to PDA and PLA. There is no plaque. Mild aortic annular calcification close to ostium of RCA.   Left main is a large artery that gives rise to LAD and LCX arteries.   LAD is a large vessel that has mild stenosis at bifurcation of first diagonal 0-24%.   LCX is a non-dominant artery that gives rise to one large OM1 branch. There is no plaque.   Other findings:   Normal pulmonary vein drainage into the left atrium.   Normal left atrial appendage without a thrombus.   Normal size of the pulmonary artery.   Please see radiology report for non cardiac findings.   IMPRESSION: 1. Coronary calcium score of 0.   2. Normal coronary origin with right dominance.   3. Mild LAD mid vessel stenosis at bifurcation of first diagonal (0-24%).     Electronically Signed   By: Linwood Dibbles.D.  On: 07/25/2021 16:58  FFRCT ANALYSIS   FINDINGS: FFRct analysis was performed on the original cardiac CT angiogram dataset. Diagrammatic representation of the FFRct analysis is provided in a  separate PDF document in PACS. This dictation was created using the PDF document and an interactive 3D model of the results. 3D model is not available in the EMR/PACS. Normal FFR range is >0.80.   1. Left Main: Normal   2. LAD: Normal   3. LCX: Normal   4. Ramus: N/a   5. RCA: Normal 0.97 post ostial moderate stenosis.   IMPRESSION: Normal FFR  ASSESSMENT AND PLAN:  1.  Demand ischemia. She had elevated troponins following a bladder biopsy and  in the setting of Afib with RVR and anemia.  LV function was good by Echo. On ASA, Statin and metoprolol. She has atypical chest pain most c/w reflux. Coronary CTA with minimal nonobstructive CAD 2. HTN controlled 3. HLD. On Statin now. Excellent control. 4. Paroxysmal Afib during bladder surgery. No recurrence. Event monitor without Afib. Will defer anticoagulation unless she has documented Afib.  5. Lower extremity edema. Normal Echo. Continue lasix 40 mg daily. Restrict sodium intake. Resolved.    Current medicines are reviewed at length with the patient today.  The patient does not have concerns regarding medicines.  The following changes have been made:  no change  Labs/ tests ordered today include:   No orders of the defined types were placed in this encounter.    Disposition:   FU with me in one year  Signed, Kacie Huxtable Martin[place], MD  08/22/2021 1:32 PM    [facility] [address] [place], [place], [address] Phone [phone], Fax [phone]

## 2021-08-20 LAB — BCR-ABL1, CML/ALL, PCR, QUANT: Interpretation (BCRAL):: NEGATIVE

## 2021-08-20 MED FILL — Iron Sucrose Inj 20 MG/ML (Fe Equiv): INTRAVENOUS | Qty: 10 | Status: AC

## 2021-08-21 ENCOUNTER — Inpatient Hospital Stay: Payer: Medicare HMO | Attending: Hematology and Oncology

## 2021-08-21 VITALS — BP 143/56 | HR 74 | Temp 98.3°F | Resp 18 | Ht 65.4 in | Wt 210.0 lb

## 2021-08-21 DIAGNOSIS — D509 Iron deficiency anemia, unspecified: Secondary | ICD-10-CM | POA: Insufficient documentation

## 2021-08-21 MED ORDER — SODIUM CHLORIDE 0.9 % IV SOLN
200.0000 mg | Freq: Once | INTRAVENOUS | Status: AC
  Administered 2021-08-21: 200 mg via INTRAVENOUS
  Filled 2021-08-21: qty 200

## 2021-08-21 MED ORDER — SODIUM CHLORIDE 0.9 % IV SOLN: Freq: Once | INTRAVENOUS | Status: AC

## 2021-08-21 NOTE — Patient Instructions (Signed)

## 2021-08-22 ENCOUNTER — Encounter: Payer: Self-pay | Admitting: Cardiology

## 2021-08-22 ENCOUNTER — Ambulatory Visit (INDEPENDENT_AMBULATORY_CARE_PROVIDER_SITE_OTHER): Payer: Medicare HMO | Admitting: Cardiology

## 2021-08-22 VITALS — BP 130/74 | HR 74 | Ht 66.5 in | Wt 216.0 lb

## 2021-08-22 DIAGNOSIS — I248 Other forms of acute ischemic heart disease: Secondary | ICD-10-CM

## 2021-08-22 DIAGNOSIS — I2510 Atherosclerotic heart disease of native coronary artery without angina pectoris: Secondary | ICD-10-CM | POA: Diagnosis not present

## 2021-08-22 DIAGNOSIS — I480 Paroxysmal atrial fibrillation: Secondary | ICD-10-CM | POA: Diagnosis not present

## 2021-08-22 MED FILL — Iron Sucrose Inj 20 MG/ML (Fe Equiv): INTRAVENOUS | Qty: 10 | Status: AC

## 2021-08-22 NOTE — Patient Instructions (Signed)
Medication Instructions:  Not needed *If you need a refill on your cardiac medications before your next appointment, please call your pharmacy*   Lab Work: No changes     Testing/Procedures: Not needed   Follow-Up: At [facility], you and your health needs are our priority.  As part of our continuing mission to provide you with exceptional heart care, we have created designated Provider Care Teams.  These Care Teams include your primary Cardiologist (physician) and Advanced Practice Providers (APPs -  Physician Assistants and Nurse Practitioners) who all work together to provide you with the care you need, when you need it.  We recommend signing up for the patient portal called "MyChart".  Sign up information is provided on this After Visit Summary.  MyChart is used to connect with patients for Virtual Visits (Telemedicine).  Patients are able to view lab/test results, encounter notes, upcoming appointments, etc.  Non-urgent messages can be sent to your provider as well.   To learn more about what you can do with MyChart, go to [email or web address].    Your next appointment:   12 month(s)  The format for your next appointment:   In Person  Provider:   Peter Martinique, MD    Important Information About Sugar

## 2021-08-25 ENCOUNTER — Inpatient Hospital Stay: Payer: Medicare HMO

## 2021-08-25 VITALS — BP 138/50 | HR 73 | Temp 98.3°F | Resp 18 | Ht 66.5 in | Wt 218.2 lb

## 2021-08-25 DIAGNOSIS — D509 Iron deficiency anemia, unspecified: Secondary | ICD-10-CM

## 2021-08-25 MED ORDER — SODIUM CHLORIDE 0.9 % IV SOLN
200.0000 mg | Freq: Once | INTRAVENOUS | Status: AC
  Administered 2021-08-25: 200 mg via INTRAVENOUS
  Filled 2021-08-25: qty 200

## 2021-08-25 MED ORDER — SODIUM CHLORIDE 0.9 % IV SOLN: Freq: Once | INTRAVENOUS | Status: AC

## 2021-08-25 NOTE — Patient Instructions (Signed)

## 2021-08-26 MED FILL — Iron Sucrose Inj 20 MG/ML (Fe Equiv): INTRAVENOUS | Qty: 10 | Status: AC

## 2021-08-27 ENCOUNTER — Inpatient Hospital Stay: Payer: Medicare HMO

## 2021-08-27 VITALS — BP 130/72 | HR 72 | Temp 98.2°F | Resp 18 | Wt 220.0 lb

## 2021-08-27 DIAGNOSIS — D509 Iron deficiency anemia, unspecified: Secondary | ICD-10-CM | POA: Diagnosis not present

## 2021-08-27 MED ORDER — SODIUM CHLORIDE 0.9 % IV SOLN
200.0000 mg | Freq: Once | INTRAVENOUS | Status: AC
  Administered 2021-08-27: 200 mg via INTRAVENOUS
  Filled 2021-08-27: qty 200

## 2021-08-27 MED ORDER — SODIUM CHLORIDE 0.9 % IV SOLN: Freq: Once | INTRAVENOUS | Status: AC

## 2021-08-27 NOTE — Patient Instructions (Signed)

## 2021-08-27 NOTE — Progress Notes (Signed)
Patient waited 30 minute pot observation with no issues VSS upon discharge.

## 2021-08-28 ENCOUNTER — Inpatient Hospital Stay: Payer: Medicare HMO

## 2021-08-28 VITALS — BP 147/55 | HR 64 | Temp 98.1°F | Resp 20 | Ht 66.5 in | Wt 221.0 lb

## 2021-08-28 DIAGNOSIS — D509 Iron deficiency anemia, unspecified: Secondary | ICD-10-CM | POA: Diagnosis not present

## 2021-08-28 MED ORDER — SODIUM CHLORIDE 0.9 % IV SOLN
200.0000 mg | Freq: Once | INTRAVENOUS | Status: AC
  Administered 2021-08-28: 200 mg via INTRAVENOUS
  Filled 2021-08-28: qty 200

## 2021-08-28 MED ORDER — SODIUM CHLORIDE 0.9 % IV SOLN: Freq: Once | INTRAVENOUS | Status: AC

## 2021-08-28 NOTE — Progress Notes (Signed)
1530-iv site left hand where iv infiltrated- swelling has decreased. Skin slightly discolored from iron.  Pt to call w/any problems.  Verbalizes understanding.

## 2021-08-28 NOTE — Patient Instructions (Signed)
Iron Deficiency Anemia, Adult Iron deficiency anemia is a condition in which the concentration of red blood cells or hemoglobin in the blood is below normal because of too little iron. Hemoglobin is a substance in red blood cells that carries oxygen to the body's tissues. When the concentration of red blood cells or hemoglobin is too low, not enough oxygen reaches these tissues. Iron deficiency anemia is usually long-lasting, and it develops over time. It may or may not cause symptoms. It is a common type of anemia. What are the causes? This condition may be caused by: Not enough iron in the diet. Abnormal absorption in the gut. Increased need for iron because of pregnancy or heavy menstrual periods, for females. Cancers of the gastrointestinal system, such as colon cancer. Blood loss caused by bleeding in the intestine. This may be from a gastrointestinal condition like Crohn's disease. Frequent blood draws, such as from blood donation. What increases the risk? The following factors may make you more likely to develop this condition: Being pregnant. Being a teenage girl going through a growth spurt. What are the signs or symptoms? Symptoms of this condition may include: Pale skin, lips, and nail beds. Weakness, dizziness, and getting tired easily. Headache. Shortness of breath when moving or exercising. Cold hands and feet. Fast or irregular heartbeat. Irritability or rapid breathing. These are more common in severe anemia. Mild anemia may not cause any symptoms. How is this diagnosed? This condition is diagnosed based on: Your medical history. A physical exam. Blood tests. You may have additional tests to find the underlying cause of your anemia, such as: Testing for blood in the stool (fecal occult blood test). A procedure to see inside your colon and rectum (colonoscopy). A procedure to see inside your esophagus and stomach (endoscopy). A test in which cells are removed from  bone marrow (bone marrow aspiration) or fluid is removed from the bone marrow to be examined. This is rarely needed. How is this treated? This condition is treated by correcting the cause of your iron deficiency. Treatment may involve: Adding iron-rich foods to your diet. Taking iron supplements. If you are pregnant or breastfeeding, you may need to take extra iron because your normal diet usually does not provide the amount of iron that you need. Increasing vitamin C intake. Vitamin C helps your body absorb iron. Your health care provider may recommend that you take iron supplements along with a glass of orange juice or a vitamin C supplement. Medicines to make heavy menstrual flow lighter. Surgery. You may need repeat blood tests to determine whether treatment is working. If the treatment does not seem to be working, you may need more tests. Follow these instructions at home: Medicines Take over-the-counter and prescription medicines only as told by your health care provider. This includes iron supplements and vitamins. For the best iron absorption, you should take iron supplements when your stomach is empty. If you cannot tolerate them on an empty stomach, you may need to take them with food. Do not drink milk or take antacids at the same time as your iron supplements. Milk and antacids may interfere with iron absorption. Iron supplements may turn stool (feces) a darker color and it may appear black. If you cannot tolerate taking iron supplements by mouth, talk with your health care provider about taking them through an IV or through an injection into a muscle. Eating and drinking  Talk with your health care provider before changing your diet. He or she may recommend   that you eat foods that contain a lot of iron, such as: Liver. Low-fat (lean) beef. Breads and cereals that have iron added to them (are fortified). Eggs. Dried fruit. Dark green, leafy vegetables. To help your body use the  iron from iron-rich foods, eat those foods at the same time as fresh fruits and vegetables that are high in vitamin C. Foods that are high in vitamin C include: Oranges. Peppers. Tomatoes. Mangoes. Drink enough fluid to keep your urine pale yellow. Managing constipation If you are taking an iron supplement, it may cause constipation. To prevent or treat constipation, you may need to: Take over-the-counter or prescription medicines. Eat foods that are high in fiber, such as beans, whole grains, and fresh fruits and vegetables. Limit foods that are high in fat and processed sugars, such as fried or sweet foods. General instructions Return to your normal activities as told by your health care provider. Ask your health care provider what activities are safe for you. Practice good hygiene. Anemia can make you more prone to illness and infection. Keep all follow-up visits as told by your health care provider. This is important. Contact a health care provider if you: Feel nauseous or you vomit. Feel weak. Have unexplained sweating. Develop symptoms of constipation, such as: Having fewer than three bowel movements a week. Straining to have a bowel movement. Having stools that are hard, dry, or larger than normal. Feeling full or bloated. Pain in the lower abdomen. Not feeling relief after having a bowel movement. Get help right away if you: Faint. If this happens, do not drive yourself to the hospital. Have chest pain. Have shortness of breath that: Is severe. Gets worse with physical activity. Have an irregular or rapid heartbeat. Become light-headed when getting up from a sitting or lying down position. These symptoms may represent a serious problem that is an emergency. Do not wait to see if the symptoms will go away. Get medical help right away. Call your local emergency services (911 in the [place] Do not drive yourself to the hospital. Summary Iron deficiency anemia is a condition in  which the concentration of red blood cells or hemoglobin in the blood is below normal because of too little iron. This condition is treated by correcting the cause of your iron deficiency. Take over-the-counter and prescription medicines only as told by your health care provider. This includes iron supplements and vitamins. To help your body use the iron from iron-rich foods, eat those foods at the same time as fresh fruits and vegetables that are high in vitamin C. Get help right away if you have shortness of breath that gets worse with physical activity. This information is not intended to replace advice given to you by your health care provider. Make sure you discuss any questions you have with your health care provider. Document Revised: 11/15/2018 Document Reviewed: 11/15/2018 Elsevier Patient Education  2022 [employer]  

## 2021-09-01 ENCOUNTER — Ambulatory Visit: Payer: Medicare HMO

## 2021-09-04 ENCOUNTER — Other Ambulatory Visit: Payer: Self-pay | Admitting: Hematology and Oncology

## 2021-09-04 DIAGNOSIS — E538 Deficiency of other specified B group vitamins: Secondary | ICD-10-CM

## 2021-09-08 ENCOUNTER — Ambulatory Visit: Payer: Medicare HMO

## 2021-09-15 ENCOUNTER — Encounter: Payer: Self-pay | Admitting: Hematology and Oncology

## 2021-09-29 ENCOUNTER — Inpatient Hospital Stay: Payer: Medicare HMO

## 2021-09-29 ENCOUNTER — Encounter: Payer: Self-pay | Admitting: Hematology and Oncology

## 2021-09-29 ENCOUNTER — Inpatient Hospital Stay: Payer: Medicare HMO | Attending: Hematology and Oncology | Admitting: Hematology and Oncology

## 2021-09-29 ENCOUNTER — Other Ambulatory Visit: Payer: Self-pay | Admitting: Hematology and Oncology

## 2021-09-29 DIAGNOSIS — D649 Anemia, unspecified: Secondary | ICD-10-CM | POA: Diagnosis not present

## 2021-09-29 DIAGNOSIS — D509 Iron deficiency anemia, unspecified: Secondary | ICD-10-CM

## 2021-09-29 LAB — IRON AND TIBC
Iron: 71 ug/dL (ref 28–170)
Saturation Ratios: 22 % (ref 10.4–31.8)
TIBC: 319 ug/dL (ref 250–450)
UIBC: 248 ug/dL

## 2021-09-29 LAB — CBC AND DIFFERENTIAL
HCT: 37 (ref 36–46)
Hemoglobin: 12.4 (ref 12.0–16.0)
Neutrophils Absolute: 5.77
Platelets: 248 10*3/uL (ref 150–400)
WBC: 10.3

## 2021-09-29 LAB — CBC: RBC: 4.1 (ref 3.87–5.11)

## 2021-09-29 LAB — FERRITIN: Ferritin: 180 ng/mL (ref 11–307)

## 2021-09-29 NOTE — Progress Notes (Cosign Needed)
Patient Care Team: Jeannene Patella, PA as PCP - General (Physician Assistant) Martinique, Peter M, MD as PCP - Cardiology (Cardiology)  Clinic Day:  09/29/2021  Referring physician: Jeannene Patella, PA  ASSESSMENT & PLAN:   Assessment & Plan: Iron deficiency anemia A 76 year old female with history of iron deficiency anemia here for 4 week evaluation. She received her Venofer without complication and has responded very well with CBC today revealing hemoglobin 12.4. She denies any residual symptoms. We will see her back in 3 months for repeat evaluation.     The patient understands the plans discussed today and is in agreement with them.  She knows to contact our office if she develops concerns prior to her next appointment.     Melodye Ped, NP  [facility] [address] [place] [address] Dept: [phone] Dept Fax: [phone]   Orders Placed This Encounter  Procedures   CBC and differential    This external order was created through the Results Console.   CBC    This external order was created through the Results Console.      CHIEF COMPLAINT:  CC: A 76 year old female with history of iron deficiency anemia here for 4 week evaluation.  Current Treatment:  Surveillance  INTERVAL HISTORY:  Taylor Arroyo is here today for repeat clinical assessment. She denies fevers or chills. She denies pain. Her appetite is good. Her weight has been stable.  I have reviewed the past medical history, past surgical history, social history and family history with the patient and they are unchanged from previous note.  ALLERGIES:  is allergic to penicillins.  MEDICATIONS:  Current Outpatient Medications  Medication Sig Dispense Refill   acetaminophen (TYLENOL) 500 MG tablet Take 500 mg by mouth every 6 (six) hours as needed for pain.     albuterol (VENTOLIN HFA) 108 (90 Base) MCG/ACT inhaler Inhale 2 puffs into the  lungs every 6 (six) hours as needed.     aspirin EC 81 MG tablet Take 1 tablet (81 mg total) by mouth daily. Swallow whole. 90 tablet 3   budesonide-formoterol (SYMBICORT) 160-4.5 MCG/ACT inhaler Inhale 2 puffs into the lungs 2 (two) times daily.     Calcium Ascorbate 500 MG TABS Take by mouth.     Cholecalciferol (VITAMIN D3) 10 MCG (400 UNIT) CAPS Take 1 tablet by mouth daily.     cyanocobalamin (,VITAMIN B-12,) 1000 MCG/ML injection INJECT 1 ML (1,000 MCG TOTAL) INTO THE MUSCLE ONCE A WEEK. 4 mL 0   cyclobenzaprine (FLEXERIL) 10 MG tablet TAKE 1 TABLET THREE TIMES A DAY AS NEEDED FOR MUSCLE SPASMS 30 tablet 2   enalapril (VASOTEC) 20 MG tablet Take 20 mg by mouth daily.     furosemide (LASIX) 40 MG tablet Take 40 mg by mouth.     ipratropium (ATROVENT) 0.06 % nasal spray Place into both nostrils.     Iron-Vitamins (GERITOL PO) Take 1 tablet by mouth at bedtime.     metoprolol tartrate (LOPRESSOR) 25 MG tablet Take 1 tablet (25 mg total) by mouth 2 (two) times daily. 180 tablet 3   montelukast (SINGULAIR) 10 MG tablet Take 10 mg by mouth daily.     nitroGLYCERIN (NITROSTAT) 0.3 MG SL tablet Place 0.3 mg under the tongue every 5 (five) minutes as needed for chest pain.     omega-3 acid ethyl esters (LOVAZA) 1 g capsule Take 1 capsule by mouth 2 (two) times  daily.     Omega-3 Fatty Acids (FISH OIL) 1000 MG CAPS Take 1,000 mg by mouth daily.     omeprazole (PRILOSEC) 40 MG capsule Take 40 mg by mouth daily.     rosuvastatin (CRESTOR) 20 MG tablet Take 1 tablet (20 mg total) by mouth daily. 90 tablet 3   triamterene-hydrochlorothiazide (DYAZIDE) 37.5-25 MG per capsule Take 1 capsule by mouth every morning.     venlafaxine XR (EFFEXOR-XR) 75 MG 24 hr capsule Take 75 mg by mouth daily.     No current facility-administered medications for this visit.    HISTORY OF PRESENT ILLNESS:   Oncology History   No history exists.      REVIEW OF SYSTEMS:   Constitutional: Denies fevers, chills or  abnormal weight loss Eyes: Denies blurriness of vision Ears, nose, mouth, throat, and face: Denies mucositis or sore throat Respiratory: Denies cough, dyspnea or wheezes Cardiovascular: Denies palpitation, chest discomfort or lower extremity swelling Gastrointestinal:  Denies nausea, heartburn or change in bowel habits Skin: Denies abnormal skin rashes Lymphatics: Denies new lymphadenopathy or easy bruising Neurological:Denies numbness, tingling or new weaknesses Behavioral/Psych: Mood is stable, no new changes  All other systems were reviewed with the patient and are negative.   VITALS:  Blood pressure (!) 168/71, pulse 60, temperature 98 F (36.7 C), temperature source Oral, resp. rate 20, height 5' 6.5" (1.689 Arroyo), weight 215 lb 6.4 oz (97.7 kg), SpO2 92 %.  Wt Readings from Last 3 Encounters:  09/29/21 215 lb 6.4 oz (97.7 kg)  08/28/21 221 lb (100.2 kg)  08/27/21 220 lb (99.8 kg)    Body mass index is 34.25 kg/Arroyo.  Performance status (ECOG): 1 - Symptomatic but completely ambulatory  PHYSICAL EXAM:   GENERAL:alert, no distress and comfortable SKIN: skin color, texture, turgor are normal, no rashes or significant lesions EYES: normal, Conjunctiva are pink and non-injected, sclera clear OROPHARYNX:no exudate, no erythema and lips, buccal mucosa, and tongue normal  NECK: supple, thyroid normal size, non-tender, without nodularity LYMPH:  no palpable lymphadenopathy in the cervical, axillary or inguinal LUNGS: clear to auscultation and percussion with normal breathing effort HEART: regular rate & rhythm and no murmurs and no lower extremity edema ABDOMEN:abdomen soft, non-tender and normal bowel sounds Musculoskeletal:no cyanosis of digits and no clubbing  NEURO: alert & oriented x 3 with fluent speech, no focal motor/sensory deficits  LABORATORY DATA:  I have reviewed the data as listed    Component Value Date/Time   NA 136 07/14/2021 1502   K 4.0 07/14/2021 1502   CL 98  07/14/2021 1502   CO2 25 07/14/2021 1502   GLUCOSE 130 (H) 07/14/2021 1502   BUN 15 07/14/2021 1502   CREATININE 0.81 07/14/2021 1502   CALCIUM 9.0 07/14/2021 1502    No results found for: "SPEP", "UPEP"  Lab Results  Component Value Date   WBC 10.3 09/29/2021   NEUTROABS 5.77 09/29/2021   HGB 12.4 09/29/2021   HCT 37 09/29/2021   PLT 248 09/29/2021      Chemistry      Component Value Date/Time   NA 136 07/14/2021 1502   K 4.0 07/14/2021 1502   CL 98 07/14/2021 1502   CO2 25 07/14/2021 1502   BUN 15 07/14/2021 1502   CREATININE 0.81 07/14/2021 1502      Component Value Date/Time   CALCIUM 9.0 07/14/2021 1502       RADIOGRAPHIC STUDIES: I have personally reviewed the radiological images as listed and agreed with the  findings in the report. No results found.

## 2021-09-29 NOTE — Assessment & Plan Note (Signed)
A 76 year old female with history of iron deficiency anemia here for 4 week evaluation. She received her Venofer without complication and has responded very well with CBC today revealing hemoglobin 12.4. She denies any residual symptoms. We will see her back in 3 months for repeat evaluation.

## 2021-10-06 ENCOUNTER — Ambulatory Visit: Payer: Medicare HMO

## 2021-10-12 ENCOUNTER — Other Ambulatory Visit: Payer: Self-pay | Admitting: Hematology and Oncology

## 2021-10-12 DIAGNOSIS — E538 Deficiency of other specified B group vitamins: Secondary | ICD-10-CM

## 2021-10-20 ENCOUNTER — Other Ambulatory Visit: Payer: Self-pay | Admitting: Cardiology

## 2021-10-29 ENCOUNTER — Encounter: Payer: Self-pay | Admitting: Hematology and Oncology

## 2021-10-29 ENCOUNTER — Other Ambulatory Visit: Payer: Self-pay | Admitting: Hematology and Oncology

## 2021-10-29 DIAGNOSIS — E538 Deficiency of other specified B group vitamins: Secondary | ICD-10-CM

## 2021-10-29 MED ORDER — CYANOCOBALAMIN 1000 MCG/ML IJ SOLN: 1000.0000 ug | INTRAMUSCULAR | 0 refills | Status: DC

## 2021-10-30 DIAGNOSIS — F419 Anxiety disorder, unspecified: Secondary | ICD-10-CM | POA: Diagnosis not present

## 2021-10-30 DIAGNOSIS — Z79899 Other long term (current) drug therapy: Secondary | ICD-10-CM | POA: Diagnosis not present

## 2021-10-30 DIAGNOSIS — D509 Iron deficiency anemia, unspecified: Secondary | ICD-10-CM | POA: Diagnosis not present

## 2021-10-30 DIAGNOSIS — I10 Essential (primary) hypertension: Secondary | ICD-10-CM | POA: Diagnosis not present

## 2021-11-03 ENCOUNTER — Ambulatory Visit: Payer: Medicare HMO

## 2021-11-06 DIAGNOSIS — H5203 Hypermetropia, bilateral: Secondary | ICD-10-CM | POA: Diagnosis not present

## 2021-11-25 DIAGNOSIS — H25811 Combined forms of age-related cataract, right eye: Secondary | ICD-10-CM | POA: Diagnosis not present

## 2021-11-25 DIAGNOSIS — Z01818 Encounter for other preprocedural examination: Secondary | ICD-10-CM | POA: Diagnosis not present

## 2021-11-25 DIAGNOSIS — H25813 Combined forms of age-related cataract, bilateral: Secondary | ICD-10-CM | POA: Diagnosis not present

## 2021-11-27 ENCOUNTER — Other Ambulatory Visit: Payer: Self-pay | Admitting: Hematology and Oncology

## 2021-11-27 DIAGNOSIS — E538 Deficiency of other specified B group vitamins: Secondary | ICD-10-CM

## 2021-12-01 ENCOUNTER — Ambulatory Visit: Payer: Medicare HMO

## 2021-12-02 ENCOUNTER — Encounter: Payer: Self-pay | Admitting: Hematology and Oncology

## 2021-12-08 ENCOUNTER — Telehealth: Payer: Self-pay | Admitting: *Deleted

## 2021-12-08 NOTE — Telephone Encounter (Signed)
   Patient Name: Taylor Arroyo  DOB: 26-Oct-1945 MRN: [id number]  Primary Cardiologist: Peter Martinique, MD  Chart reviewed as part of pre-operative protocol coverage. Cataract extractions are recognized in guidelines as low risk surgeries that do not typically require specific preoperative testing or holding of blood thinner therapy. Therefore, given past medical history and time since last visit, based on ACC/AHA guidelines, Kathelene Rumberger would be at acceptable risk for the planned procedure without further cardiovascular testing.   I will route this recommendation to the requesting party via Epic fax function and remove from pre-op pool.  Please call with questions.  Elgie Collard, PA-C 12/08/2021, 1:01 PM

## 2021-12-08 NOTE — Telephone Encounter (Signed)
   Pre-operative Risk Assessment    Patient Name: Taylor Arroyo  DOB: 1946-02-18 MRN: [id number]      Request for Surgical Clearance    Procedure:   CATARACT SURGERY  Date of Surgery:  Clearance 12/16/21                                 Surgeon:  DR. Weston Settle Surgeon's Group or Practice Name:  [facility]  Phone number:  [phone] Fax number:  [phone]   Type of Clearance Requested:   - Medical ; NO MEDICATIONS LISTED AS NEEDING TO BE HELD   Type of Anesthesia:   IV SEDATION   Additional requests/questions:    Jiles Prows   12/08/2021, 11:44 AM

## 2021-12-16 DIAGNOSIS — H52223 Regular astigmatism, bilateral: Secondary | ICD-10-CM | POA: Diagnosis not present

## 2021-12-16 DIAGNOSIS — H25811 Combined forms of age-related cataract, right eye: Secondary | ICD-10-CM | POA: Diagnosis not present

## 2021-12-16 DIAGNOSIS — Z7982 Long term (current) use of aspirin: Secondary | ICD-10-CM | POA: Diagnosis not present

## 2021-12-16 DIAGNOSIS — I10 Essential (primary) hypertension: Secondary | ICD-10-CM | POA: Diagnosis not present

## 2021-12-16 DIAGNOSIS — H259 Unspecified age-related cataract: Secondary | ICD-10-CM | POA: Diagnosis not present

## 2021-12-29 ENCOUNTER — Ambulatory Visit: Payer: Medicare HMO

## 2021-12-30 DIAGNOSIS — Z79899 Other long term (current) drug therapy: Secondary | ICD-10-CM | POA: Diagnosis not present

## 2021-12-30 DIAGNOSIS — H25812 Combined forms of age-related cataract, left eye: Secondary | ICD-10-CM | POA: Diagnosis not present

## 2021-12-30 DIAGNOSIS — D649 Anemia, unspecified: Secondary | ICD-10-CM | POA: Diagnosis not present

## 2021-12-30 DIAGNOSIS — H52223 Regular astigmatism, bilateral: Secondary | ICD-10-CM | POA: Diagnosis not present

## 2021-12-30 DIAGNOSIS — H259 Unspecified age-related cataract: Secondary | ICD-10-CM | POA: Diagnosis not present

## 2021-12-30 DIAGNOSIS — I10 Essential (primary) hypertension: Secondary | ICD-10-CM | POA: Diagnosis not present

## 2021-12-30 DIAGNOSIS — E7800 Pure hypercholesterolemia, unspecified: Secondary | ICD-10-CM | POA: Diagnosis not present

## 2022-01-02 ENCOUNTER — Other Ambulatory Visit: Payer: Self-pay | Admitting: Hematology and Oncology

## 2022-01-02 DIAGNOSIS — E538 Deficiency of other specified B group vitamins: Secondary | ICD-10-CM

## 2022-01-03 ENCOUNTER — Other Ambulatory Visit: Payer: Self-pay | Admitting: Hematology and Oncology

## 2022-01-03 DIAGNOSIS — E538 Deficiency of other specified B group vitamins: Secondary | ICD-10-CM

## 2022-01-05 ENCOUNTER — Telehealth: Payer: Self-pay | Admitting: Hematology and Oncology

## 2022-01-05 ENCOUNTER — Encounter: Payer: Self-pay | Admitting: Hematology and Oncology

## 2022-01-05 NOTE — Telephone Encounter (Signed)
Contacted pt to schedule an appt. Unable to reach via phone, voicemail was left.   Scheduling Message Entered by Rosanne Sack A on 01/02/2022 at  2:13 PM Priority: Routine <No visit type provided>  Department: [facility]  Provider:   Scheduling Notes:  I sent in 90 day B12 refill. She needs labs and f/u with me before we can refill again. Thanks

## 2022-01-12 ENCOUNTER — Other Ambulatory Visit: Payer: Self-pay | Admitting: Hematology and Oncology

## 2022-01-12 DIAGNOSIS — E538 Deficiency of other specified B group vitamins: Secondary | ICD-10-CM

## 2022-01-20 ENCOUNTER — Other Ambulatory Visit: Payer: Self-pay | Admitting: Cardiology

## 2022-01-21 ENCOUNTER — Encounter: Payer: Self-pay | Admitting: Hematology and Oncology

## 2022-01-21 ENCOUNTER — Other Ambulatory Visit: Payer: Self-pay | Admitting: Hematology and Oncology

## 2022-01-21 ENCOUNTER — Inpatient Hospital Stay: Payer: Medicare HMO | Attending: Hematology and Oncology | Admitting: Hematology and Oncology

## 2022-01-21 ENCOUNTER — Inpatient Hospital Stay: Payer: Medicare HMO

## 2022-01-21 VITALS — BP 161/72 | HR 63 | Temp 97.7°F | Resp 18 | Ht 66.5 in | Wt 210.2 lb

## 2022-01-21 DIAGNOSIS — D509 Iron deficiency anemia, unspecified: Secondary | ICD-10-CM

## 2022-01-21 DIAGNOSIS — D72829 Elevated white blood cell count, unspecified: Secondary | ICD-10-CM | POA: Diagnosis not present

## 2022-01-21 DIAGNOSIS — Z1231 Encounter for screening mammogram for malignant neoplasm of breast: Secondary | ICD-10-CM

## 2022-01-21 DIAGNOSIS — D229 Melanocytic nevi, unspecified: Secondary | ICD-10-CM

## 2022-01-21 DIAGNOSIS — D649 Anemia, unspecified: Secondary | ICD-10-CM | POA: Diagnosis not present

## 2022-01-21 DIAGNOSIS — E538 Deficiency of other specified B group vitamins: Secondary | ICD-10-CM | POA: Diagnosis not present

## 2022-01-21 HISTORY — DX: Deficiency of other specified B group vitamins: E53.8

## 2022-01-21 LAB — IRON AND TIBC
Iron: 102 ug/dL (ref 28–170)
Saturation Ratios: 27 % (ref 10.4–31.8)
TIBC: 374 ug/dL (ref 250–450)
UIBC: 272 ug/dL

## 2022-01-21 LAB — CBC AND DIFFERENTIAL
HCT: 43 (ref 36–46)
Hemoglobin: 14.5 (ref 12.0–16.0)
MCV: 94 (ref 81–99)
Neutrophils Absolute: 6.79
Platelets: 160 10*3/uL (ref 150–400)
WBC: 11.7

## 2022-01-21 LAB — CBC: RBC: 4.56 (ref 3.87–5.11)

## 2022-01-21 LAB — FERRITIN: Ferritin: 97 ng/mL (ref 11–307)

## 2022-01-21 LAB — VITAMIN B12: Vitamin B-12: 326 pg/mL (ref 180–914)

## 2022-01-21 NOTE — Progress Notes (Unsigned)
[facility]  [address] [place],  [place]  [address] [phone]  Clinic Day:  01/21/2022  Referring physician: Jeannene Patella, PA   HISTORY OF PRESENT ILLNESS:  The patient is a 76 y.o. female with anemia due to both iron and B12 deficiency.  She received IV Venofer in May and June, with normalization of her hemoglobin and iron studies.  She is here today for repeat clinical assessment and states that since we switched her B12 injections to every 4 weeks, she has been more fatigued.  She denies any overt form of bleeding.  She states that she craves sweet foods, but denies pica to ice.  She is concerned about a lesion on her back, which is itchy and burning.  She reports arthralgias of the hands and knees.  She denies fevers, chills or night sweats.  She denies other signs of infection.  She denies change in her appetite, but she has lost some weight.  She has never had a colonoscopy or endoscopy.  Her last mammogram and breast exam was in 1984.  REVIEW OF SYSTEMS:  Review of Systems  Constitutional:  Positive for fever and unexpected weight change. Negative for appetite change, chills and fatigue.  HENT:   Negative for lump/mass, mouth sores and sore throat.   Respiratory:  Positive for shortness of breath. Negative for cough.   Cardiovascular:  Negative for chest pain and leg swelling.  Gastrointestinal:  Negative for abdominal distention, abdominal pain, blood in stool, constipation, diarrhea, nausea, rectal pain and vomiting.  Endocrine: Negative for hot flashes.  Genitourinary:  Negative for difficulty urinating, dysuria, frequency and hematuria.   Musculoskeletal:  Positive for arthralgias. Negative for back pain and myalgias.  Skin:  Negative for rash.  Neurological:  Negative for dizziness and headaches.  Hematological:  Negative for adenopathy. Does not bruise/bleed easily.  Psychiatric/Behavioral:  Negative for depression and sleep  disturbance. The patient is not nervous/anxious.       PHYSICAL EXAM:  Blood pressure (!) 161/72, pulse 63, temperature 97.7 F (36.5 C), temperature source Oral, resp. rate 18, height 5' 6.5" (1.689 m), weight 210 lb 3.2 oz (95.3 kg), SpO2 97 %. Wt Readings from Last 3 Encounters:  01/21/22 210 lb 3.2 oz (95.3 kg)  09/29/21 215 lb 6.4 oz (97.7 kg)  08/28/21 221 lb (100.2 kg)   Body mass index is 33.42 kg/m.  Performance status (ECOG): 1 - Symptomatic but completely ambulatory  Physical Exam Vitals and nursing note reviewed.  Constitutional:      General: She is not in acute distress.    Appearance: Normal appearance.  HENT:     Head: Normocephalic and atraumatic.     Mouth/Throat:     Mouth: Mucous membranes are moist.     Pharynx: Oropharynx is clear. No oropharyngeal exudate or posterior oropharyngeal erythema.  Eyes:     General: No scleral icterus.    Extraocular Movements: Extraocular movements intact.     Conjunctiva/sclera: Conjunctivae normal.     Pupils: Pupils are equal, round, and reactive to light.  Cardiovascular:     Rate and Rhythm: Normal rate and regular rhythm.     Heart sounds: Normal heart sounds. No murmur heard.    No friction rub. No gallop.  Pulmonary:     Effort: Pulmonary effort is normal.     Breath sounds: Normal breath sounds. No wheezing, rhonchi or rales.  Chest:  Breasts:    Right: Normal. No swelling, bleeding, inverted  nipple, mass, nipple discharge, skin change or tenderness.     Left: Normal. No swelling, bleeding, inverted nipple, mass, nipple discharge, skin change or tenderness.  Abdominal:     General: There is no distension.     Palpations: Abdomen is soft. There is no hepatomegaly, splenomegaly or mass.     Tenderness: There is no abdominal tenderness.  Musculoskeletal:        General: Normal range of motion.     Cervical back: Normal range of motion and neck supple. No tenderness.     Right lower leg: No edema.     Left  lower leg: No edema.  Lymphadenopathy:     Cervical: No cervical adenopathy.     Upper Body:     Right upper body: No supraclavicular or axillary adenopathy.     Left upper body: No supraclavicular or axillary adenopathy.     Lower Body: No right inguinal adenopathy. No left inguinal adenopathy.  Skin:    General: Skin is warm and dry.     Coloration: Skin is not jaundiced.     Findings: Lesion (There are multiple benign-appearing moles especially of her back.  The one in the right mid back is mildly irritated without bleeding.) present. No rash.  Neurological:     Mental Status: She is alert and oriented to person, place, and time.     Cranial Nerves: No cranial nerve deficit.  Psychiatric:        Mood and Affect: Mood normal.        Behavior: Behavior normal.        Thought Content: Thought content normal.     LABS:      Latest Ref Rng & Units 01/21/2022   12:00 AM 09/29/2021   12:00 AM 08/11/2021   12:00 AM  CBC  WBC  11.7     10.3     13.0      Hemoglobin 12.0 - 16.0 14.5     12.4     11.2      Hematocrit 36 - 46 43     37     35      Platelets 150 - 400 K/uL 160     248     325         This result is from an external source.      Latest Ref Rng & Units 07/14/2021    3:02 PM 04/07/2021    2:03 PM 02/25/2021    2:15 PM  CMP  Glucose 70 - 99 mg/dL 130  76  101   BUN 8 - 27 mg/dL '15  22  18   '$ Creatinine 0.57 - 1.00 mg/dL 0.81  1.00  0.98   Sodium 134 - 144 mmol/L 136  138  138   Potassium 3.5 - 5.2 mmol/L 4.0  4.4  4.3   Chloride 96 - 106 mmol/L 98  97  100   CO2 20 - 29 mmol/L '25  22  24   '$ Calcium 8.7 - 10.3 mg/dL 9.0  9.4  9.4      No results found for: "CEA1", "CEA" / No results found for: "CEA1", "CEA" No results found for: "PSA1" No results found for: "WER154" No results found for: "CAN125"  No results found for: "TOTALPROTELP", "ALBUMINELP", "A1GS", "A2GS", "BETS", "BETA2SER", "GAMS", "MSPIKE", "SPEI" Lab Results  Component Value Date   TIBC 374 01/21/2022    TIBC 319 09/29/2021   TIBC 500 (H) 08/11/2021   FERRITIN 97  01/21/2022   FERRITIN 180 09/29/2021   FERRITIN 12 08/11/2021   IRONPCTSAT 27 01/21/2022   IRONPCTSAT 22 09/29/2021   IRONPCTSAT 9 (L) 08/11/2021   No results found for: "LDH"  No results found for: "AFPTUMOR", "TOTALPROTELP", "ALBUMINELP", "A1GS", "A2GS", "BETS", "BETA2SER", "GAMS", "MSPIKE", "SPEI", "LDH", "CEA1", "CEA", "PSA1", "IGASERUM", "IGGSERUM", "IGMSERUM", "THGAB", "THYROGLB"  Review Flowsheet  More data may exist      Latest Ref Rng & Units 08/11/2021 09/29/2021 01/21/2022  Oncology Labs  Ferritin 11 - 307 ng/mL 12  180  97   %SAT 10.4 - 31.8 % '9  22  27      '$ ASSESSMENT & PLAN:   Assessment/Plan:  76 y.o. female with anemia due to both iron deficiency and B12 deficiency.  Her iron stores and B12 remain normal.  I recommended that she continue B12 on a monthly basis.  If she continues to feel poorly, I recommended she see her primary care provider.  I will arrange for her to undergo mammogram and refer her to GI for evaluation of iron deficiency.  I will refer her to dermatology for the lesion on her back.  I will plan to see her back in 3 months for repeat clinical assessment.  The patient understands all the plans discussed today and is in agreement with them.  She knows to contact our office if she develops concerns prior to her next appointment    Marvia Pickles, PA-C

## 2022-01-22 ENCOUNTER — Telehealth: Payer: Self-pay

## 2022-01-22 ENCOUNTER — Encounter: Payer: Self-pay | Admitting: Hematology and Oncology

## 2022-01-22 DIAGNOSIS — D72829 Elevated white blood cell count, unspecified: Secondary | ICD-10-CM

## 2022-01-22 HISTORY — DX: Elevated white blood cell count, unspecified: D72.829

## 2022-01-22 NOTE — Telephone Encounter (Signed)
-----   Message from Marvia Pickles, PA-C sent at 01/21/2022  2:07 PM EDT ----- Please refer to Dr. Lyda Jester for evaluation iron deficiency anemia. She has never had a colonoscopy.Thanks

## 2022-01-22 NOTE — Assessment & Plan Note (Signed)
Mild leukocytosis of uncertain etiology, with a normal differential. Her WBC was elevated at consultation in May, then normalized in July.  She reports continued fatigue despite a normal hemoglobin, but denies other symptoms of infection.  We will continue to monitor this.

## 2022-01-22 NOTE — Telephone Encounter (Signed)
-----   Message from Marvia Pickles, PA-C sent at 01/22/2022  8:08 AM EDT ----- Please let her know iron stores and B12 normal, so she just needs to continue B12 every 4 weeks. If she continues to feel poorly, recommend she see her PCP. Thanks

## 2022-01-22 NOTE — Addendum Note (Signed)
Addended by: Daryel November on: 01/22/2022 02:45 PM   Modules accepted: Orders

## 2022-01-22 NOTE — Telephone Encounter (Signed)
Patient notified and voiced understanding.

## 2022-01-22 NOTE — Telephone Encounter (Signed)
-----   Message from Marvia Pickles, PA-C sent at 01/22/2022  8:24 AM EDT ----- Please refer to dermatology for irritated mole on right mid back. I'm sorry I forgot to ask you yesterday.

## 2022-01-22 NOTE — Telephone Encounter (Signed)
Referral faxed

## 2022-01-23 ENCOUNTER — Other Ambulatory Visit: Payer: Self-pay | Admitting: Hematology and Oncology

## 2022-01-23 ENCOUNTER — Telehealth: Payer: Self-pay

## 2022-01-23 DIAGNOSIS — E538 Deficiency of other specified B group vitamins: Secondary | ICD-10-CM

## 2022-01-23 NOTE — Telephone Encounter (Signed)
Message left for patient iron infusions not needed appt cancelled for infusions and when follow-up clinic appts have been changed to.

## 2022-01-23 NOTE — Telephone Encounter (Signed)
-----   Message from Marvia Pickles, PA-C sent at 01/23/2022  8:42 AM EDT ----- Please let her know iron infusions are not needed. Appointments for infusions cancelled. She really doesn't need to see me in December. I rescheduled her appts to Feb 1st at 1:30 for labs, 2 for me. Thank you

## 2022-01-26 ENCOUNTER — Ambulatory Visit: Payer: Medicare HMO

## 2022-01-27 ENCOUNTER — Ambulatory Visit: Payer: Medicare HMO

## 2022-01-27 DIAGNOSIS — Z961 Presence of intraocular lens: Secondary | ICD-10-CM | POA: Diagnosis not present

## 2022-01-29 ENCOUNTER — Ambulatory Visit: Payer: Medicare HMO

## 2022-02-02 ENCOUNTER — Ambulatory Visit: Payer: Medicare HMO

## 2022-02-04 ENCOUNTER — Ambulatory Visit: Payer: Medicare HMO

## 2022-02-06 ENCOUNTER — Ambulatory Visit: Payer: Medicare HMO

## 2022-02-09 DIAGNOSIS — Z23 Encounter for immunization: Secondary | ICD-10-CM | POA: Diagnosis not present

## 2022-02-10 DIAGNOSIS — M62838 Other muscle spasm: Secondary | ICD-10-CM | POA: Diagnosis not present

## 2022-02-10 DIAGNOSIS — D509 Iron deficiency anemia, unspecified: Secondary | ICD-10-CM | POA: Diagnosis not present

## 2022-02-10 DIAGNOSIS — J309 Allergic rhinitis, unspecified: Secondary | ICD-10-CM | POA: Diagnosis not present

## 2022-02-10 DIAGNOSIS — I10 Essential (primary) hypertension: Secondary | ICD-10-CM | POA: Diagnosis not present

## 2022-02-10 DIAGNOSIS — K219 Gastro-esophageal reflux disease without esophagitis: Secondary | ICD-10-CM | POA: Diagnosis not present

## 2022-02-23 ENCOUNTER — Ambulatory Visit: Payer: Medicare HMO

## 2022-02-24 DIAGNOSIS — N3000 Acute cystitis without hematuria: Secondary | ICD-10-CM | POA: Diagnosis not present

## 2022-02-24 DIAGNOSIS — I10 Essential (primary) hypertension: Secondary | ICD-10-CM | POA: Diagnosis not present

## 2022-02-24 DIAGNOSIS — R350 Frequency of micturition: Secondary | ICD-10-CM | POA: Diagnosis not present

## 2022-03-04 ENCOUNTER — Other Ambulatory Visit: Payer: Medicare HMO

## 2022-03-04 ENCOUNTER — Ambulatory Visit: Payer: Medicare HMO | Admitting: Hematology and Oncology

## 2022-03-06 ENCOUNTER — Other Ambulatory Visit: Payer: Self-pay | Admitting: Hematology and Oncology

## 2022-03-06 DIAGNOSIS — E538 Deficiency of other specified B group vitamins: Secondary | ICD-10-CM

## 2022-03-24 ENCOUNTER — Ambulatory Visit: Payer: Medicare HMO

## 2022-04-20 ENCOUNTER — Ambulatory Visit: Payer: Medicare HMO

## 2022-04-22 NOTE — Progress Notes (Deleted)
[facility]  [address] [place],  [place]  [address] [phone]  Clinic Day:  01/21/2022  Referring physician: Jeannene Patella, PA   HISTORY OF PRESENT ILLNESS:  The patient is a 77 y.o. female with anemia due to both iron and B12 deficiency.  She received IV Venofer in May and June, with normalization of her hemoglobin and iron studies.  She continues B12 injections every 4 weeks. She denies progressive fatigue concerning for worsening anemia she denies any overt form of bleeding.   She had never had a colonoscopy or endoscopy and last mammogram was in 1984.  At her last visit, breast exam was normal.  I ordered a screening mammogram.  I referred her to GI for consideration of EGD and colonoscopy.  There was an abnormal lesion on her back and I referred her to dermatology.  We received a note from the dermatology office stating they could not reach the patient.  As far as I can tell, she was never scheduled for mammogram or with GI.  REVIEW OF SYSTEMS:  Review of Systems  Constitutional:  Negative for appetite change, chills, fatigue, fever and unexpected weight change.  HENT:   Negative for lump/mass, mouth sores and sore throat.   Respiratory:  Negative for cough and shortness of breath.   Cardiovascular:  Negative for chest pain and leg swelling.  Gastrointestinal:  Negative for abdominal pain, constipation, diarrhea, nausea and vomiting.  Endocrine: Negative for hot flashes.  Genitourinary:  Negative for difficulty urinating, dysuria, frequency and hematuria.   Musculoskeletal:  Negative for arthralgias, back pain and myalgias.  Skin:  Negative for rash.  Neurological:  Negative for dizziness and headaches.  Hematological:  Negative for adenopathy. Does not bruise/bleed easily.  Psychiatric/Behavioral:  Negative for depression and sleep disturbance. The patient is not nervous/anxious.       PHYSICAL EXAM:  There were no vitals taken for  this visit. Wt Readings from Last 3 Encounters:  01/21/22 210 lb 3.2 oz (95.3 kg)  09/29/21 215 lb 6.4 oz (97.7 kg)  08/28/21 221 lb (100.2 kg)   There is no height or weight on file to calculate BMI.  Performance status (ECOG): 1 - Symptomatic but completely ambulatory  Physical Exam Vitals and nursing note reviewed.  Constitutional:      General: She is not in acute distress.    Appearance: Normal appearance.  HENT:     Head: Normocephalic and atraumatic.     Mouth/Throat:     Mouth: Mucous membranes are moist.     Pharynx: Oropharynx is clear. No oropharyngeal exudate or posterior oropharyngeal erythema.  Eyes:     General: No scleral icterus.    Extraocular Movements: Extraocular movements intact.     Conjunctiva/sclera: Conjunctivae normal.     Pupils: Pupils are equal, round, and reactive to light.  Cardiovascular:     Rate and Rhythm: Normal rate and regular rhythm.     Heart sounds: Normal heart sounds. No murmur heard.    No friction rub. No gallop.  Pulmonary:     Effort: Pulmonary effort is normal.     Breath sounds: Normal breath sounds. No wheezing, rhonchi or rales.  Abdominal:     General: There is no distension.     Palpations: Abdomen is soft. There is no hepatomegaly, splenomegaly or mass.     Tenderness: There is no abdominal tenderness.  Musculoskeletal:        General: Normal range of motion.  Cervical back: Normal range of motion and neck supple. No tenderness.     Right lower leg: No edema.     Left lower leg: No edema.  Lymphadenopathy:     Cervical: No cervical adenopathy.     Upper Body:     Right upper body: No supraclavicular or axillary adenopathy.     Left upper body: No supraclavicular or axillary adenopathy.     Lower Body: No right inguinal adenopathy. No left inguinal adenopathy.  Skin:    General: Skin is warm and dry.     Coloration: Skin is not jaundiced.     Findings: No rash.  Neurological:     Mental Status: She is alert  and oriented to person, place, and time.     Cranial Nerves: No cranial nerve deficit.  Psychiatric:        Mood and Affect: Mood normal.        Behavior: Behavior normal.        Thought Content: Thought content normal.     LABS:      Latest Ref Rng & Units 01/21/2022   12:00 AM 09/29/2021   12:00 AM 08/11/2021   12:00 AM  CBC  WBC  11.7     10.3     13.0      Hemoglobin 12.0 - 16.0 14.5     12.4     11.2      Hematocrit 36 - 46 43     37     35      Platelets 150 - 400 K/uL 160     248     325         This result is from an external source.       Latest Ref Rng & Units 07/14/2021    3:02 PM 04/07/2021    2:03 PM 02/25/2021    2:15 PM  CMP  Glucose 70 - 99 mg/dL 130  76  101   BUN 8 - 27 mg/dL '15  22  18   '$ Creatinine 0.57 - 1.00 mg/dL 0.81  1.00  0.98   Sodium 134 - 144 mmol/L 136  138  138   Potassium 3.5 - 5.2 mmol/L 4.0  4.4  4.3   Chloride 96 - 106 mmol/L 98  97  100   CO2 20 - 29 mmol/L '25  22  24   '$ Calcium 8.7 - 10.3 mg/dL 9.0  9.4  9.4      No results found for: "CEA1", "CEA" / No results found for: "CEA1", "CEA" No results found for: "PSA1" No results found for: "[id number]" No results found for: "CAN125"  No results found for: "TOTALPROTELP", "ALBUMINELP", "A1GS", "A2GS", "BETS", "BETA2SER", "GAMS", "MSPIKE", "SPEI" Lab Results  Component Value Date   TIBC 374 01/21/2022   TIBC 319 09/29/2021   TIBC 500 (H) 08/11/2021   FERRITIN 97 01/21/2022   FERRITIN 180 09/29/2021   FERRITIN 12 08/11/2021   IRONPCTSAT 27 01/21/2022   IRONPCTSAT 22 09/29/2021   IRONPCTSAT 9 (L) 08/11/2021   No results found for: "LDH"  No results found for: "AFPTUMOR", "TOTALPROTELP", "ALBUMINELP", "A1GS", "A2GS", "BETS", "BETA2SER", "GAMS", "MSPIKE", "SPEI", "LDH", "CEA1", "CEA", "PSA1", "IGASERUM", "IGGSERUM", "IGMSERUM", "THGAB", "THYROGLB"  Review Flowsheet  More data may exist      Latest Ref Rng & Units 08/11/2021 09/29/2021 01/21/2022  Oncology Labs  Ferritin 11 - 307 ng/mL 12   180  97   %SAT 10.4 - 31.8 % 9  22  4     ASSESSMENT & PLAN:   Assessment/Plan:  77 y.o. female with anemia due to both iron deficiency and B12 deficiency.  Her iron stores and B12 remain normal.  I recommended that she continue B12 on a monthly basis. I will plan to see her back in 3 months for repeat clinical assessment.  The patient understands all the plans discussed today and is in agreement with them.  She knows to contact our office if she develops concerns prior to her next appointment    Marvia Pickles, PA-C

## 2022-04-23 ENCOUNTER — Telehealth: Payer: Self-pay

## 2022-04-23 ENCOUNTER — Other Ambulatory Visit: Payer: Medicare HMO

## 2022-04-23 ENCOUNTER — Ambulatory Visit: Payer: Medicare HMO | Admitting: Hematology and Oncology

## 2022-04-23 NOTE — Telephone Encounter (Signed)
Patient had an appt sch with them for 04/09/2022 and she cancelled it and has not rescheduled. She also has not showed up for her appt with [place] this afternoon.

## 2022-04-23 NOTE — Telephone Encounter (Signed)
-----  Message from Marvia Pickles, PA-C sent at 04/22/2022  5:12 PM EST ----- At her last visit we referred to Dr. Lyda Jester. Will you check with his office to see if she has seen him or what happened to the referral? Thanks

## 2022-05-18 ENCOUNTER — Ambulatory Visit: Payer: Medicare HMO

## 2022-06-02 DIAGNOSIS — Z139 Encounter for screening, unspecified: Secondary | ICD-10-CM | POA: Diagnosis not present

## 2022-06-02 DIAGNOSIS — M62830 Muscle spasm of back: Secondary | ICD-10-CM | POA: Diagnosis not present

## 2022-06-02 DIAGNOSIS — Z1331 Encounter for screening for depression: Secondary | ICD-10-CM | POA: Diagnosis not present

## 2022-06-02 DIAGNOSIS — Z9181 History of falling: Secondary | ICD-10-CM | POA: Diagnosis not present

## 2022-06-02 DIAGNOSIS — M170 Bilateral primary osteoarthritis of knee: Secondary | ICD-10-CM | POA: Diagnosis not present

## 2022-06-15 ENCOUNTER — Ambulatory Visit: Payer: Medicare HMO

## 2022-06-18 ENCOUNTER — Other Ambulatory Visit: Payer: Self-pay | Admitting: Hematology and Oncology

## 2022-06-18 DIAGNOSIS — E538 Deficiency of other specified B group vitamins: Secondary | ICD-10-CM

## 2022-07-13 ENCOUNTER — Ambulatory Visit: Payer: Medicare HMO

## 2022-07-14 DIAGNOSIS — M1711 Unilateral primary osteoarthritis, right knee: Secondary | ICD-10-CM | POA: Diagnosis not present

## 2022-07-14 DIAGNOSIS — M25562 Pain in left knee: Secondary | ICD-10-CM | POA: Diagnosis not present

## 2022-07-14 DIAGNOSIS — M25561 Pain in right knee: Secondary | ICD-10-CM | POA: Diagnosis not present

## 2022-07-14 DIAGNOSIS — M1712 Unilateral primary osteoarthritis, left knee: Secondary | ICD-10-CM | POA: Diagnosis not present

## 2022-08-03 DIAGNOSIS — Z01818 Encounter for other preprocedural examination: Secondary | ICD-10-CM | POA: Diagnosis not present

## 2022-08-03 DIAGNOSIS — J45909 Unspecified asthma, uncomplicated: Secondary | ICD-10-CM | POA: Diagnosis not present

## 2022-08-03 DIAGNOSIS — I10 Essential (primary) hypertension: Secondary | ICD-10-CM | POA: Diagnosis not present

## 2022-08-03 DIAGNOSIS — I480 Paroxysmal atrial fibrillation: Secondary | ICD-10-CM | POA: Diagnosis not present

## 2022-08-03 DIAGNOSIS — E785 Hyperlipidemia, unspecified: Secondary | ICD-10-CM | POA: Diagnosis not present

## 2022-08-03 DIAGNOSIS — N3281 Overactive bladder: Secondary | ICD-10-CM | POA: Diagnosis not present

## 2022-08-03 DIAGNOSIS — N3941 Urge incontinence: Secondary | ICD-10-CM | POA: Diagnosis not present

## 2022-08-03 DIAGNOSIS — R54 Age-related physical debility: Secondary | ICD-10-CM | POA: Diagnosis not present

## 2022-08-03 DIAGNOSIS — D72829 Elevated white blood cell count, unspecified: Secondary | ICD-10-CM | POA: Diagnosis not present

## 2022-08-03 DIAGNOSIS — Z01812 Encounter for preprocedural laboratory examination: Secondary | ICD-10-CM | POA: Diagnosis not present

## 2022-08-03 DIAGNOSIS — Z79899 Other long term (current) drug therapy: Secondary | ICD-10-CM | POA: Diagnosis not present

## 2022-08-03 DIAGNOSIS — Z7951 Long term (current) use of inhaled steroids: Secondary | ICD-10-CM | POA: Diagnosis not present

## 2022-08-03 DIAGNOSIS — R600 Localized edema: Secondary | ICD-10-CM | POA: Diagnosis not present

## 2022-08-03 DIAGNOSIS — Z01810 Encounter for preprocedural cardiovascular examination: Secondary | ICD-10-CM | POA: Diagnosis not present

## 2022-08-03 DIAGNOSIS — M1712 Unilateral primary osteoarthritis, left knee: Secondary | ICD-10-CM | POA: Diagnosis not present

## 2022-08-03 DIAGNOSIS — Z6837 Body mass index (BMI) 37.0-37.9, adult: Secondary | ICD-10-CM | POA: Diagnosis not present

## 2022-08-03 DIAGNOSIS — E669 Obesity, unspecified: Secondary | ICD-10-CM | POA: Diagnosis not present

## 2022-08-07 DIAGNOSIS — Z01818 Encounter for other preprocedural examination: Secondary | ICD-10-CM | POA: Diagnosis not present

## 2022-08-07 DIAGNOSIS — M1712 Unilateral primary osteoarthritis, left knee: Secondary | ICD-10-CM | POA: Diagnosis not present

## 2022-08-10 ENCOUNTER — Ambulatory Visit: Payer: Medicare HMO

## 2022-08-12 DIAGNOSIS — I10 Essential (primary) hypertension: Secondary | ICD-10-CM | POA: Diagnosis not present

## 2022-08-12 DIAGNOSIS — Z96652 Presence of left artificial knee joint: Secondary | ICD-10-CM | POA: Diagnosis not present

## 2022-08-12 DIAGNOSIS — J45909 Unspecified asthma, uncomplicated: Secondary | ICD-10-CM | POA: Diagnosis not present

## 2022-08-12 DIAGNOSIS — Z471 Aftercare following joint replacement surgery: Secondary | ICD-10-CM | POA: Diagnosis not present

## 2022-08-12 DIAGNOSIS — M1712 Unilateral primary osteoarthritis, left knee: Secondary | ICD-10-CM | POA: Diagnosis not present

## 2022-08-12 DIAGNOSIS — G8918 Other acute postprocedural pain: Secondary | ICD-10-CM | POA: Diagnosis not present

## 2022-08-12 DIAGNOSIS — E669 Obesity, unspecified: Secondary | ICD-10-CM | POA: Diagnosis not present

## 2022-08-12 DIAGNOSIS — E785 Hyperlipidemia, unspecified: Secondary | ICD-10-CM | POA: Diagnosis not present

## 2022-08-31 DIAGNOSIS — N390 Urinary tract infection, site not specified: Secondary | ICD-10-CM | POA: Diagnosis not present

## 2022-09-09 DIAGNOSIS — E782 Mixed hyperlipidemia: Secondary | ICD-10-CM | POA: Diagnosis not present

## 2022-09-09 DIAGNOSIS — R300 Dysuria: Secondary | ICD-10-CM | POA: Diagnosis not present

## 2022-09-09 DIAGNOSIS — J309 Allergic rhinitis, unspecified: Secondary | ICD-10-CM | POA: Diagnosis not present

## 2022-09-09 DIAGNOSIS — Z79899 Other long term (current) drug therapy: Secondary | ICD-10-CM | POA: Diagnosis not present

## 2022-09-09 DIAGNOSIS — M62838 Other muscle spasm: Secondary | ICD-10-CM | POA: Diagnosis not present

## 2022-09-09 DIAGNOSIS — K219 Gastro-esophageal reflux disease without esophagitis: Secondary | ICD-10-CM | POA: Diagnosis not present

## 2022-09-09 DIAGNOSIS — I480 Paroxysmal atrial fibrillation: Secondary | ICD-10-CM | POA: Diagnosis not present

## 2022-09-09 DIAGNOSIS — I10 Essential (primary) hypertension: Secondary | ICD-10-CM | POA: Diagnosis not present

## 2022-09-09 DIAGNOSIS — D509 Iron deficiency anemia, unspecified: Secondary | ICD-10-CM | POA: Diagnosis not present

## 2022-09-15 DIAGNOSIS — M25562 Pain in left knee: Secondary | ICD-10-CM | POA: Diagnosis not present

## 2022-10-27 DIAGNOSIS — N390 Urinary tract infection, site not specified: Secondary | ICD-10-CM | POA: Diagnosis not present

## 2022-12-08 DIAGNOSIS — U071 COVID-19: Secondary | ICD-10-CM | POA: Diagnosis not present

## 2022-12-08 DIAGNOSIS — R6889 Other general symptoms and signs: Secondary | ICD-10-CM | POA: Diagnosis not present

## 2023-02-16 DIAGNOSIS — M1711 Unilateral primary osteoarthritis, right knee: Secondary | ICD-10-CM | POA: Diagnosis not present

## 2023-02-27 ENCOUNTER — Other Ambulatory Visit: Payer: Self-pay | Admitting: Oncology

## 2023-02-27 ENCOUNTER — Other Ambulatory Visit: Payer: Self-pay | Admitting: Hematology and Oncology

## 2023-02-27 DIAGNOSIS — E538 Deficiency of other specified B group vitamins: Secondary | ICD-10-CM

## 2023-03-03 ENCOUNTER — Encounter: Payer: Self-pay | Admitting: Hematology and Oncology

## 2023-03-10 DIAGNOSIS — Z23 Encounter for immunization: Secondary | ICD-10-CM | POA: Diagnosis not present

## 2023-03-22 DIAGNOSIS — E782 Mixed hyperlipidemia: Secondary | ICD-10-CM | POA: Diagnosis not present

## 2023-03-22 DIAGNOSIS — M62838 Other muscle spasm: Secondary | ICD-10-CM | POA: Diagnosis not present

## 2023-03-22 DIAGNOSIS — J309 Allergic rhinitis, unspecified: Secondary | ICD-10-CM | POA: Diagnosis not present

## 2023-03-22 DIAGNOSIS — I480 Paroxysmal atrial fibrillation: Secondary | ICD-10-CM | POA: Diagnosis not present

## 2023-03-22 DIAGNOSIS — M1711 Unilateral primary osteoarthritis, right knee: Secondary | ICD-10-CM | POA: Diagnosis not present

## 2023-03-22 DIAGNOSIS — I10 Essential (primary) hypertension: Secondary | ICD-10-CM | POA: Diagnosis not present

## 2023-03-22 DIAGNOSIS — Z79899 Other long term (current) drug therapy: Secondary | ICD-10-CM | POA: Diagnosis not present

## 2023-03-28 ENCOUNTER — Other Ambulatory Visit: Payer: Self-pay | Admitting: Cardiology

## 2023-04-27 DIAGNOSIS — I10 Essential (primary) hypertension: Secondary | ICD-10-CM | POA: Diagnosis not present

## 2023-04-28 ENCOUNTER — Other Ambulatory Visit: Payer: Self-pay | Admitting: Cardiology

## 2023-05-02 ENCOUNTER — Other Ambulatory Visit: Payer: Self-pay | Admitting: Cardiology

## 2023-05-17 ENCOUNTER — Other Ambulatory Visit: Payer: Self-pay | Admitting: Cardiology

## 2023-05-27 ENCOUNTER — Other Ambulatory Visit: Payer: Self-pay | Admitting: Cardiology

## 2023-06-03 ENCOUNTER — Encounter: Payer: Self-pay | Admitting: Hematology and Oncology

## 2023-06-03 ENCOUNTER — Telehealth: Payer: Self-pay | Admitting: Cardiology

## 2023-06-03 MED ORDER — ROSUVASTATIN CALCIUM 20 MG PO TABS: 20.0000 mg | ORAL_TABLET | Freq: Every day | ORAL | 0 refills | Status: DC

## 2023-06-03 NOTE — Telephone Encounter (Signed)
 Pt's medication was sent to pt's pharmacy as requested. Confirmation received.

## 2023-06-03 NOTE — Telephone Encounter (Signed)
 Called pt to inform pt that she needed to contact her PCP for a refill on medication calcium ascorbate 500 mg tablet, because Dr. Swaziland did not prescribe this medication and it is not a cardiac medication. I advised pt that if she has any other cardiac problems, questions or concerns, to give our office a call back. Pt verbalized understanding.

## 2023-06-03 NOTE — Telephone Encounter (Signed)
*  STAT* If patient is at the pharmacy, call can be transferred to refill team.   1. Which medications need to be refilled? (please list name of each medication and dose if known)   Calcium Ascorbate 500 MG TABS   2. Which pharmacy/location (including street and city if local pharmacy) is medication to be sent to? [id number] [place], [place] - [address] AT [facility] Phone: [phone]  Fax: [phone]     3. Do they need a 30 day or 90 day supply? 90

## 2023-06-03 NOTE — Telephone Encounter (Signed)
*  STAT* If patient is at the pharmacy, call can be transferred to refill team.   1. Which medications need to be refilled? (please list name of each medication and dose if known) rosuvastatin (CRESTOR) 20 MG tablet   2. Which pharmacy/location (including street and city if local pharmacy) is medication to be sent to? [id number] [place], [place] - [address] AT [employer] [facility] Phone: [phone]  Fax: [phone]     3. Do they need a 30 day or 90 day supply? 90

## 2023-06-10 ENCOUNTER — Inpatient Hospital Stay

## 2023-06-10 ENCOUNTER — Inpatient Hospital Stay: Admitting: Hematology and Oncology

## 2023-06-28 ENCOUNTER — Other Ambulatory Visit: Payer: Self-pay | Admitting: Cardiology

## 2023-06-29 ENCOUNTER — Ambulatory Visit: Admitting: Cardiology

## 2023-07-06 ENCOUNTER — Telehealth: Payer: Self-pay | Admitting: Cardiology

## 2023-07-06 NOTE — Telephone Encounter (Signed)
*  STAT* If patient is at the pharmacy, call can be transferred to refill team.   1. Which medications need to be refilled? (please list name of each medication and dose if known) Calcium Ascorbate 500 MG TABS    2. Would you like to learn more about the convenience, safety, & potential cost savings by using the [facility]? No   3. Are you open to using the [facility] No   4. Which pharmacy/location (including street and city if local pharmacy) is medication to be sent to?[id number] - [place], [place] - [address] AT [place]    5. Do they need a 30 day or 90 day supply? 30 Day Supply  Pt is scheduled to see NP Palmer Bobo on 07/14/23

## 2023-07-06 NOTE — Telephone Encounter (Signed)
 Called pt to inform her that she needed to contact her PCP for a refill on medication calcium ascorbate 500 mg tablets. I advised pt that if she has any other problems, questions or concerns, to give our office a call back. Pt verbalized understanding.

## 2023-07-12 ENCOUNTER — Other Ambulatory Visit: Payer: Self-pay | Admitting: Cardiology

## 2023-07-14 ENCOUNTER — Encounter: Payer: Self-pay | Admitting: Emergency Medicine

## 2023-07-14 ENCOUNTER — Ambulatory Visit: Attending: Emergency Medicine | Admitting: Emergency Medicine

## 2023-07-14 VITALS — BP 136/66 | HR 62 | Ht 66.5 in | Wt 203.0 lb

## 2023-07-14 DIAGNOSIS — E785 Hyperlipidemia, unspecified: Secondary | ICD-10-CM | POA: Diagnosis not present

## 2023-07-14 DIAGNOSIS — I2510 Atherosclerotic heart disease of native coronary artery without angina pectoris: Secondary | ICD-10-CM

## 2023-07-14 DIAGNOSIS — I480 Paroxysmal atrial fibrillation: Secondary | ICD-10-CM

## 2023-07-14 DIAGNOSIS — I10 Essential (primary) hypertension: Secondary | ICD-10-CM

## 2023-07-14 DIAGNOSIS — R600 Localized edema: Secondary | ICD-10-CM | POA: Diagnosis not present

## 2023-07-14 MED ORDER — ROSUVASTATIN CALCIUM 40 MG PO TABS: 40.0000 mg | ORAL_TABLET | Freq: Every day | ORAL | 3 refills | Status: DC

## 2023-07-14 NOTE — Progress Notes (Signed)
 Cardiology Office Note:    Date:  07/14/2023  ID:  Taylor Arroyo, DOB 1945/07/21, MRN [id number] PCP: No primary care provider on file.   HeartCare Providers Cardiologist:  Peter Swaziland, MD       Patient Profile:      Chief Complaint: 1 year follow-up for coronary artery disease, paroxysmal atrial fibrillation History of Present Illness:  Taylor Arroyo is a 78 y.o. female with visit-pertinent history of hypertension, hyperlipidemia, paroxysmal atrial fibrillation, coronary artery disease  She established with cardiology service on 01/13/2021 for evaluation of NSTEMI.  She was admitted for bladder biopsy at [facility] in October 2022.  She went into rapid atrial fibrillation.  She experienced some chest burning.  Cardiac enzymes are elevated, troponins were 0.15, 0.21, 0.31.  Echocardiogram 12/26/2020 showed LVEF 55 to 60%, mild LVH, impaired diastolic function, mild LAE.  ZIO monitor 04/07/2021 showed normal sinus rhythm, 2 episodes of SVT longest lasting 16 beats, 2 episodes of NSVT, this less than 12 beats, no atrial fibrillation noted.  Coronary CTA 07/25/2021 coronary calcium  0, mild LAD mid vessel stenosis at bifurcation of first diagonal (0-24%) with normal FFR.  Patient was last seen in clinic on 08/22/2021.  She was doing well.  She noted to have atypical chest pain most consistent with reflux, further ischemic evaluation was deferred due to minimal nonobstructive CAD.  She had no symptoms of recurrent atrial fibrillation, anticoagulation was deferred unless she had documented atrial fibrillation.   Discussed the use of AI scribe software for clinical note transcription with the patient, who gave verbal consent to proceed.  History of Present Illness Today the patient presents for follow-up visit.  She is without any acute cardiovascular concerns or complaints today. She notes she has been in good health over the past 2 years.  She does note that her PCP is with  [facility].  She reports a stressful life due to personal circumstances, including caring for a sick spouse and moving houses. The patient's blood pressure has been fluctuating and she has switched from metoprolol  to propranolol by her PCP for better blood pressure control.  The patient denies any chest pains or feeling like she is going back into atrial fibrillation. The patient's cholesterol level is higher than desired, despite being on rosuvastatin .  She denies chest pain, shortness of breath, lightheadedness, dizziness, lower extremity edema, fatigue, palpitations, melena, hematuria, hemoptysis, diaphoresis, weakness, presyncope, syncope, orthopnea, and PND.  Review of systems:  Please see the history of present illness. All other systems are reviewed and otherwise negative.     Home Medications:    Current Meds  Medication Sig   B-D 3CC LUER-LOK SYR 25GX1" 25G X 1" 3 ML MISC USE AS DIRECTED TO INJECT CYANOCOBALAMIN  EVERY 30 DAYS   Bioflavonoid Products (ESTER-C PO) Take 1,000 mg by mouth daily at 6 (six) AM.   budesonide-formoterol (SYMBICORT) 160-4.5 MCG/ACT inhaler Inhale 2 puffs into the lungs 2 (two) times daily.   CALCIUM  MAGNESIUM ZINC PO Take 1 tablet by mouth daily at 6 (six) AM.   cyanocobalamin  (VITAMIN B12) 1000 MCG/ML injection INJECT 1 ML (1,000 MCG) INTRAMUSCULARLY EVERY 30 DAYS   diazepam (VALIUM) 5 MG tablet Take 5 mg by mouth daily as needed.   enalapril (VASOTEC) 20 MG tablet Take 20 mg by mouth daily.   furosemide  (LASIX ) 40 MG tablet Take 40 mg by mouth daily as needed.   ipratropium (ATROVENT) 0.06 % nasal spray Place into both nostrils.   montelukast (SINGULAIR) 10 MG  tablet Take 10 mg by mouth daily.   propranolol (INDERAL) 80 MG tablet Take 80 mg by mouth 2 (two) times daily.   pyridoxine (B-6) 100 MG tablet Take 100 mg by mouth daily.   rosuvastatin  (CRESTOR ) 40 MG tablet Take 1 tablet (40 mg total) by mouth daily.   triamterene-hydrochlorothiazide  (DYAZIDE) 37.5-25 MG per capsule Take 1 capsule by mouth every morning.   [DISCONTINUED] rosuvastatin  (CRESTOR ) 20 MG tablet Take 1 tablet (20 mg total) by mouth daily. PLEASE KEEP UPCOMING APPOINTMENT IN ORDER TO RECEIVE FUTURE REFILLS. THANK YOU!   Studies Reviewed:       ZIO 04/07/2021 Normal sinus rhythm 2 episodes of SVT longest lasting 16 beats. 2 episodes of NSVT longest lasting 12 beats No Afib noted.  Coronary CTA 07/25/2021 1. Coronary calcium  score of 0.   2. Normal coronary origin with right dominance.   3. Mild LAD mid vessel stenosis at bifurcation of first diagonal (0-24%). Risk Assessment/Calculations:    CHA2DS2-VASc Score =     This indicates a  % annual risk of stroke. The patient's score is based upon:              Physical Exam:   VS:  BP 136/66 (BP Location: Left Arm, Patient Position: Sitting, Cuff Size: Normal)   Pulse 62   Ht 5' 6.5" (1.689 m)   Wt 203 lb (92.1 kg)   SpO2 96%   BMI 32.27 kg/m    Wt Readings from Last 3 Encounters:  07/14/23 203 lb (92.1 kg)  01/21/22 210 lb 3.2 oz (95.3 kg)  09/29/21 215 lb 6.4 oz (97.7 kg)    GEN: Well nourished, well developed in no acute distress NECK: No JVD; No carotid bruits CARDIAC: RRR, no murmurs, rubs, gallops RESPIRATORY:  Clear to auscultation without rales, wheezing or rhonchi  ABDOMEN: Soft, non-tender, non-distended EXTREMITIES:  No edema; No acute deformity      Assessment and Plan:  Coronary artery disease H/o elevated troponins following a bladder biopsy in the setting of A-fib with RVR and anemia Coronary CTA 07/2021 with coronary calcium  score of 0, mild LAD mid vessel stenosis of bifurcation of first diagonal (0-24%) - Today patient is without anginal symptoms, no indication for further ischemic evaluation at this time - Continue aspirin  81 mg daily and rosuvastatin  20 mg daily  Hypertension Blood pressure today is 136/66 - Systolic BP slightly above goal less than 130.  Begin  monitoring BP at home and notify office if BP remains greater than 130/80 - Continue enalapril 20 mg daily, Lasix  40 mg daily, propranolol 80 mg twice daily, triamterene-hydrochlorothiazide 37.5 - 25 mg daily - Has been managed by PCP  Hyperlipidemia LDL 87, HDL 59, TC 167, TG 119 on 95/62/1308 LDL currently not at goal of less than 70 with evidence of mild nonobstructive CAD - Plan to increase rosuvastatin  to 40 mg daily and repeat fasting lipid panel and LFTs x 8 weeks  Paroxysmal atrial fibrillation 1 occurrence s/p bladder surgery with no recurrent symptoms ZIO 05/2021 with no atrial fibrillation noted Per Dr. [place] plan will be to defer anticoagulation unless she has further documented atrial fibrillation - EKG today showing normal sinus rhythm - She denies any symptoms concerning of recurrent atrial fibrillation - Continue propranolol 80 mg twice daily  Lower extremity edema Echo 12/2020 with LVEF 55 - 60% - Denies any further lower extremity edema - Well controlled on Lasix  40 mg daily      Dispo:  Return  in about 1 year (around 07/13/2024).  Signed, Ava Boatman, NP

## 2023-07-14 NOTE — Patient Instructions (Signed)
 Medication Instructions:  INCREASE ROSUVASTATIN  CALCIUM  TO 40 MG DAILY.   Lab Work: FASTING LIPID PANEL AND LFTS IN 8 WEEKS  Testing/Procedures: NONE  Follow-Up: At [facility], you and your health needs are our priority.  As part of our continuing mission to provide you with exceptional heart care, our providers are all part of one team.  This team includes your primary Cardiologist (physician) and Advanced Practice Providers or APPs (Physician Assistants and Nurse Practitioners) who all work together to provide you with the care you need, when you need it.  Your next appointment:   1 YEAR  Provider:   Peter [place]az[place], MD  OR Palmer Bobo, DNP   We recommend signing up for the patient portal called "MyChart".  Sign up information is provided on this After Visit Summary.  MyChart is used to connect with patients for Virtual Visits (Telemedicine).  Patients are able to view lab/test results, encounter notes, upcoming appointments, etc.  Non-urgent messages can be sent to your provider as well.   To learn more about what you can do with MyChart, go to [email or web address].   Other Instructions:    1st Floor: - Lobby - Registration  - Pharmacy  - Lab - Cafe  2nd Floor: - PV Lab - Diagnostic Testing (echo, CT, nuclear med)  3rd Floor: - Vacant  4th Floor: - TCTS (cardiothoracic surgery) - AFib Clinic - Structural Heart Clinic - Vascular Surgery  - Vascular Ultrasound  5th Floor: - HeartCare Cardiology (general and EP) - Clinical Pharmacy for coumadin, hypertension, lipid, weight-loss medications, and med management appointments    Valet parking services will be available as well.

## 2023-07-23 DIAGNOSIS — W57XXXA Bitten or stung by nonvenomous insect and other nonvenomous arthropods, initial encounter: Secondary | ICD-10-CM | POA: Diagnosis not present

## 2023-07-23 DIAGNOSIS — S90569A Insect bite (nonvenomous), unspecified ankle, initial encounter: Secondary | ICD-10-CM | POA: Diagnosis not present

## 2023-08-30 DIAGNOSIS — L299 Pruritus, unspecified: Secondary | ICD-10-CM | POA: Diagnosis not present

## 2023-08-30 DIAGNOSIS — W57XXXA Bitten or stung by nonvenomous insect and other nonvenomous arthropods, initial encounter: Secondary | ICD-10-CM | POA: Diagnosis not present

## 2023-09-16 DIAGNOSIS — M1711 Unilateral primary osteoarthritis, right knee: Secondary | ICD-10-CM | POA: Diagnosis not present

## 2023-09-16 DIAGNOSIS — E782 Mixed hyperlipidemia: Secondary | ICD-10-CM | POA: Diagnosis not present

## 2023-09-16 DIAGNOSIS — I480 Paroxysmal atrial fibrillation: Secondary | ICD-10-CM | POA: Diagnosis not present

## 2023-09-16 DIAGNOSIS — I10 Essential (primary) hypertension: Secondary | ICD-10-CM | POA: Diagnosis not present

## 2023-09-16 DIAGNOSIS — Z1331 Encounter for screening for depression: Secondary | ICD-10-CM | POA: Diagnosis not present

## 2023-09-16 DIAGNOSIS — M62838 Other muscle spasm: Secondary | ICD-10-CM | POA: Diagnosis not present

## 2023-09-16 DIAGNOSIS — Z9181 History of falling: Secondary | ICD-10-CM | POA: Diagnosis not present

## 2023-09-16 DIAGNOSIS — Z139 Encounter for screening, unspecified: Secondary | ICD-10-CM | POA: Diagnosis not present

## 2023-09-16 DIAGNOSIS — Z79899 Other long term (current) drug therapy: Secondary | ICD-10-CM | POA: Diagnosis not present

## 2023-09-16 DIAGNOSIS — J309 Allergic rhinitis, unspecified: Secondary | ICD-10-CM | POA: Diagnosis not present

## 2024-01-10 ENCOUNTER — Encounter: Payer: Self-pay | Admitting: Hematology and Oncology

## 2024-01-24 DIAGNOSIS — R0989 Other specified symptoms and signs involving the circulatory and respiratory systems: Secondary | ICD-10-CM | POA: Diagnosis not present

## 2024-01-24 DIAGNOSIS — F419 Anxiety disorder, unspecified: Secondary | ICD-10-CM | POA: Diagnosis not present

## 2024-01-27 DIAGNOSIS — M1711 Unilateral primary osteoarthritis, right knee: Secondary | ICD-10-CM | POA: Diagnosis not present

## 2024-02-14 DIAGNOSIS — N390 Urinary tract infection, site not specified: Secondary | ICD-10-CM | POA: Diagnosis not present

## 2024-02-14 DIAGNOSIS — Z23 Encounter for immunization: Secondary | ICD-10-CM | POA: Diagnosis not present

## 2024-03-07 DIAGNOSIS — M62838 Other muscle spasm: Secondary | ICD-10-CM | POA: Diagnosis not present

## 2024-03-07 DIAGNOSIS — J309 Allergic rhinitis, unspecified: Secondary | ICD-10-CM | POA: Diagnosis not present

## 2024-03-07 DIAGNOSIS — I10 Essential (primary) hypertension: Secondary | ICD-10-CM | POA: Diagnosis not present

## 2024-03-07 DIAGNOSIS — E538 Deficiency of other specified B group vitamins: Secondary | ICD-10-CM | POA: Diagnosis not present

## 2024-03-07 DIAGNOSIS — M1711 Unilateral primary osteoarthritis, right knee: Secondary | ICD-10-CM | POA: Diagnosis not present

## 2024-03-07 DIAGNOSIS — D509 Iron deficiency anemia, unspecified: Secondary | ICD-10-CM | POA: Diagnosis not present

## 2024-03-07 DIAGNOSIS — Z79899 Other long term (current) drug therapy: Secondary | ICD-10-CM | POA: Diagnosis not present

## 2024-03-07 DIAGNOSIS — I480 Paroxysmal atrial fibrillation: Secondary | ICD-10-CM | POA: Diagnosis not present

## 2024-03-07 DIAGNOSIS — N39498 Other specified urinary incontinence: Secondary | ICD-10-CM | POA: Diagnosis not present

## 2024-03-07 DIAGNOSIS — E782 Mixed hyperlipidemia: Secondary | ICD-10-CM | POA: Diagnosis not present

## 2024-03-08 DIAGNOSIS — J45909 Unspecified asthma, uncomplicated: Secondary | ICD-10-CM | POA: Diagnosis not present

## 2024-03-08 DIAGNOSIS — I10 Essential (primary) hypertension: Secondary | ICD-10-CM | POA: Diagnosis not present

## 2024-03-08 DIAGNOSIS — I480 Paroxysmal atrial fibrillation: Secondary | ICD-10-CM | POA: Diagnosis not present

## 2024-03-08 DIAGNOSIS — D72829 Elevated white blood cell count, unspecified: Secondary | ICD-10-CM | POA: Diagnosis not present

## 2024-03-08 DIAGNOSIS — Z01818 Encounter for other preprocedural examination: Secondary | ICD-10-CM | POA: Diagnosis not present

## 2024-03-08 DIAGNOSIS — Z6834 Body mass index (BMI) 34.0-34.9, adult: Secondary | ICD-10-CM | POA: Diagnosis not present

## 2024-03-08 DIAGNOSIS — M1711 Unilateral primary osteoarthritis, right knee: Secondary | ICD-10-CM | POA: Diagnosis not present

## 2024-03-08 DIAGNOSIS — E66811 Obesity, class 1: Secondary | ICD-10-CM | POA: Diagnosis not present

## 2024-03-08 DIAGNOSIS — E785 Hyperlipidemia, unspecified: Secondary | ICD-10-CM | POA: Diagnosis not present

## 2024-03-08 DIAGNOSIS — R600 Localized edema: Secondary | ICD-10-CM | POA: Diagnosis not present

## 2024-03-08 DIAGNOSIS — N3281 Overactive bladder: Secondary | ICD-10-CM | POA: Diagnosis not present

## 2024-04-24 ENCOUNTER — Other Ambulatory Visit: Payer: Self-pay | Admitting: Emergency Medicine
